# Patient Record
Sex: Female | Born: 1980 | Race: Black or African American | Hispanic: No | Marital: Single | Smoking: Current every day smoker
Health system: Southern US, Community
[De-identification: ages and names within clinical notes are randomized; demographics above are authoritative.]

## PROBLEM LIST (undated history)

## (undated) DIAGNOSIS — I10 Essential (primary) hypertension: Secondary | ICD-10-CM

## (undated) HISTORY — PX: WISDOM TOOTH EXTRACTION: SHX21

---
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
## 1999-03-22 ENCOUNTER — Encounter: Payer: Self-pay | Admitting: Obstetrics and Gynecology

## 1999-03-22 ENCOUNTER — Ambulatory Visit (HOSPITAL_COMMUNITY): Admission: RE | Admit: 1999-03-22 | Discharge: 1999-03-22 | Payer: Self-pay | Admitting: Obstetrics and Gynecology

## 1999-06-01 ENCOUNTER — Inpatient Hospital Stay (HOSPITAL_COMMUNITY): Admission: AD | Admit: 1999-06-01 | Discharge: 1999-06-01 | Payer: Self-pay | Admitting: Obstetrics and Gynecology

## 1999-06-06 ENCOUNTER — Inpatient Hospital Stay (HOSPITAL_COMMUNITY): Admission: AD | Admit: 1999-06-06 | Discharge: 1999-06-10 | Payer: Self-pay | Admitting: Obstetrics & Gynecology

## 1999-12-02 ENCOUNTER — Other Ambulatory Visit: Admission: RE | Admit: 1999-12-02 | Discharge: 1999-12-02 | Payer: Self-pay | Admitting: Obstetrics & Gynecology

## 1999-12-04 ENCOUNTER — Emergency Department (HOSPITAL_COMMUNITY): Admission: EM | Admit: 1999-12-04 | Discharge: 1999-12-04 | Payer: Self-pay | Admitting: *Deleted

## 2000-11-07 ENCOUNTER — Emergency Department (HOSPITAL_COMMUNITY): Admission: EM | Admit: 2000-11-07 | Discharge: 2000-11-07 | Payer: Self-pay

## 2001-02-07 ENCOUNTER — Other Ambulatory Visit: Admission: RE | Admit: 2001-02-07 | Discharge: 2001-02-07 | Payer: Self-pay | Admitting: Obstetrics and Gynecology

## 2001-05-15 ENCOUNTER — Emergency Department (HOSPITAL_COMMUNITY): Admission: EM | Admit: 2001-05-15 | Discharge: 2001-05-15 | Payer: Self-pay | Admitting: Emergency Medicine

## 2001-12-27 ENCOUNTER — Emergency Department (HOSPITAL_COMMUNITY): Admission: EM | Admit: 2001-12-27 | Discharge: 2001-12-27 | Payer: Self-pay | Admitting: Emergency Medicine

## 2003-08-28 ENCOUNTER — Emergency Department (HOSPITAL_COMMUNITY): Admission: EM | Admit: 2003-08-28 | Discharge: 2003-08-28 | Payer: Self-pay | Admitting: Emergency Medicine

## 2004-05-19 ENCOUNTER — Emergency Department (HOSPITAL_COMMUNITY): Admission: EM | Admit: 2004-05-19 | Discharge: 2004-05-19 | Payer: Self-pay | Admitting: Emergency Medicine

## 2005-03-03 ENCOUNTER — Emergency Department (HOSPITAL_COMMUNITY): Admission: EM | Admit: 2005-03-03 | Discharge: 2005-03-03 | Payer: Self-pay | Admitting: Emergency Medicine

## 2005-03-30 ENCOUNTER — Inpatient Hospital Stay (HOSPITAL_COMMUNITY): Admission: AD | Admit: 2005-03-30 | Discharge: 2005-04-03 | Payer: Self-pay | Admitting: Obstetrics & Gynecology

## 2005-03-30 ENCOUNTER — Encounter: Payer: Self-pay | Admitting: Emergency Medicine

## 2005-06-13 ENCOUNTER — Ambulatory Visit: Payer: Self-pay | Admitting: Family Medicine

## 2005-06-27 ENCOUNTER — Ambulatory Visit: Payer: Self-pay | Admitting: Family Medicine

## 2005-06-29 ENCOUNTER — Ambulatory Visit (HOSPITAL_COMMUNITY): Admission: RE | Admit: 2005-06-29 | Discharge: 2005-06-29 | Payer: Self-pay | Admitting: *Deleted

## 2005-07-11 ENCOUNTER — Ambulatory Visit: Payer: Self-pay | Admitting: Obstetrics & Gynecology

## 2005-07-12 ENCOUNTER — Inpatient Hospital Stay (HOSPITAL_COMMUNITY): Admission: AD | Admit: 2005-07-12 | Discharge: 2005-07-13 | Payer: Self-pay | Admitting: Gynecology

## 2005-08-11 ENCOUNTER — Ambulatory Visit: Payer: Self-pay | Admitting: Family Medicine

## 2005-09-05 ENCOUNTER — Ambulatory Visit: Payer: Self-pay | Admitting: Obstetrics & Gynecology

## 2005-09-07 ENCOUNTER — Inpatient Hospital Stay (HOSPITAL_COMMUNITY): Admission: AD | Admit: 2005-09-07 | Discharge: 2005-09-10 | Payer: Self-pay | Admitting: Obstetrics & Gynecology

## 2005-09-07 ENCOUNTER — Ambulatory Visit: Payer: Self-pay | Admitting: Certified Nurse Midwife

## 2005-12-23 ENCOUNTER — Emergency Department (HOSPITAL_COMMUNITY): Admission: EM | Admit: 2005-12-23 | Discharge: 2005-12-23 | Payer: Self-pay | Admitting: Emergency Medicine

## 2006-05-03 ENCOUNTER — Emergency Department (HOSPITAL_COMMUNITY): Admission: EM | Admit: 2006-05-03 | Discharge: 2006-05-03 | Payer: Self-pay | Admitting: Emergency Medicine

## 2006-07-19 ENCOUNTER — Emergency Department (HOSPITAL_COMMUNITY): Admission: EM | Admit: 2006-07-19 | Discharge: 2006-07-19 | Payer: Self-pay | Admitting: Emergency Medicine

## 2006-11-06 ENCOUNTER — Emergency Department (HOSPITAL_COMMUNITY): Admission: EM | Admit: 2006-11-06 | Discharge: 2006-11-06 | Payer: Self-pay | Admitting: Emergency Medicine

## 2007-05-16 ENCOUNTER — Emergency Department (HOSPITAL_COMMUNITY): Admission: EM | Admit: 2007-05-16 | Discharge: 2007-05-17 | Payer: Self-pay | Admitting: Emergency Medicine

## 2007-10-12 ENCOUNTER — Emergency Department (HOSPITAL_COMMUNITY): Admission: EM | Admit: 2007-10-12 | Discharge: 2007-10-12 | Payer: Self-pay | Admitting: Emergency Medicine

## 2007-11-01 ENCOUNTER — Emergency Department (HOSPITAL_COMMUNITY): Admission: EM | Admit: 2007-11-01 | Discharge: 2007-11-01 | Payer: Self-pay | Admitting: Emergency Medicine

## 2008-05-29 ENCOUNTER — Emergency Department (HOSPITAL_COMMUNITY): Admission: EM | Admit: 2008-05-29 | Discharge: 2008-05-29 | Payer: Self-pay | Admitting: Emergency Medicine

## 2010-04-06 ENCOUNTER — Emergency Department (HOSPITAL_COMMUNITY)
Admission: EM | Admit: 2010-04-06 | Discharge: 2010-04-06 | Disposition: A | Discharge status: Home / Self Care | Payer: Medicaid Other | Attending: Emergency Medicine | Admitting: Emergency Medicine

## 2010-04-06 DIAGNOSIS — K089 Disorder of teeth and supporting structures, unspecified: Secondary | ICD-10-CM | POA: Insufficient documentation

## 2010-04-06 DIAGNOSIS — F17200 Nicotine dependence, unspecified, uncomplicated: Secondary | ICD-10-CM | POA: Insufficient documentation

## 2010-06-20 ENCOUNTER — Emergency Department (HOSPITAL_COMMUNITY)
Admission: EM | Admit: 2010-06-20 | Discharge: 2010-06-20 | Disposition: A | Discharge status: Home / Self Care | Payer: Medicaid Other | Attending: Emergency Medicine | Admitting: Emergency Medicine

## 2010-06-20 DIAGNOSIS — R220 Localized swelling, mass and lump, head: Secondary | ICD-10-CM | POA: Insufficient documentation

## 2010-06-20 DIAGNOSIS — K089 Disorder of teeth and supporting structures, unspecified: Secondary | ICD-10-CM | POA: Insufficient documentation

## 2010-06-20 DIAGNOSIS — R221 Localized swelling, mass and lump, neck: Secondary | ICD-10-CM | POA: Insufficient documentation

## 2010-07-16 NOTE — H&P (Signed)
[place]. [facility]  Patient:    [name], [name]                          MRN: [id number] Attending:  Edward Jolly, M.D.                         History and Physical  CHIEF COMPLAINT:              Ms. [name] is a 30 year old single, black female, 3, para 0-0-2-0 at 41-5/7 weeks, who was scheduled for induction of labor today for postdatism.  HISTORY OF PRESENT ILLNESS:   She called this morning, with complaint of uterine contractions every five minutes, since early morning.  She denies rupture of membranes or bleeding, and reports positive fetal movement.  She has been followed by the nurse-midwifery service since October 2000.  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  PRENATAL LABORATORY STUDIES:  Hemoglobin 11.8, hematocrit 33.3, platelets 237.  Blood type B-positive.  Sickle cell trait negative.  RPR nonreactive.  Rubella titer positive.  Hepatitis negative.  Urinalysis negative.  Pap within normal limits, with yeast.  Gonorrhea negative.  Chlamydia negative.  An AFP showed free beta within normal limits.  Glucose challenge was within normal limits.  Group  Strep was negative.  OB HISTORY:                   Remarkable for induced abortion on November 1999 t ten weeks.  Induced abortion on Michale 2000 at 12 weeks with no complications.  MEDICAL HISTORY:              Remarkable for history of gonorrhea in 1998, which was treated, occasional yeast infections, usual childhood diseases, including varicella.  FAMILY HISTORY:               Remarkable for hypertension, heart disease, diabetes, in her mother who is now deceased.  GENETIC HISTORY:              Unremarkable.  SOCIAL HISTORY:               The patient is single.  The father of the baby is  [name],  who is only intermittently involved, and not present today. The patient reports that he is less supportive than she would prefer.  She has two female friends with her today, who are very supportive and who will be her labor partners.  She works as an Administrator, sports.  She is of the [place] faith.  PHYSICAL EXAMINATION:  GENERAL:                      The patient is afebrile.  VITAL SIGNS:                  Stable.  HEENT:                        Within normal limits.  THYROID:                      Within normal limits, no masses.  BREASTS:  Soft, nontender.  LUNGS:                        Clear to auscultation bilaterally.  HEART:                        Regular rate and rhythm.  No murmur.  ABDOMEN:                      Gravid at 42 cm.  Electronic fetal monitoring reveals reactive fetal heart rate, with positive accelerations and average variability.  Uterine contractions every 3-5 minutes, mild to moderate in strength.  CERVICAL:                     Per R.N. at 1+ cm, 50% effaced, high station, vertex.  EXTREMITIES:                  Within normal limits.  ASSESSMENT: 1. An IUP at 41-5/7 weeks. 2. Early labor. 3. She is GBS negative.  PLAN: 1. Admit to birthing suite for consult with Dr. Nelida Meuse Freely. 2. Routine C.N.M. orders. 3. Consider augmentation if no change in two hours. 4. Anticipate spontaneous vaginal delivery later today.  Dictated by:  Wynelle Bourgeois, C.N.M. DD:  06/06/99 TD:  06/06/99 Job: [id number]

## 2010-07-16 NOTE — Op Note (Signed)
[facility]  Patient:    [name], [name]                        MRN: [id number] Proc. Date: 06/07/99 Adm. Date:  [id number] Attending:  Cleatrice Burke                           Operative Report  PREOPERATIVE DIAGNOSIS:       Active phase arrest.  POSTOPERATIVE DIAGNOSIS:      Active phase arrest.  Plus occipitoposterior. Plus nuchal cord.  OPERATION:                    Low transverse cesarean section.  SURGEON:                      Cecilio Asper, M.D.  ASSISTANT:                    Wynelle Bourgeois, C.N.M.  ANESTHESIA:  ESTIMATED BLOOD LOSS:         600 cc.  URINE OUTPUT:                 200 cc.  FINDINGS:                     Viable female infant, Apgars 9 and 9, weighing 9 pounds 10 ounces, occipitoposterior, nuchal cord x 1, normal tubes and ovaries.  COMPLICATIONS:                None.  INDICATIONS:                  The patient is a 30 year old, gravida 3, para 0-0-2-0, who presented with regular uterine contractions at approximately 41-5/7 weeks estimated gestational age.  The patient was scheduled for induction of labor on Alawna 8, and therefore was admitted.  The patient did not progress spontaneously. Artificial rupture of membranes was performed with moderate meconium fluid.  Amnioinfusion was begun and Pitocin augmentation was begun.  The patient achieved Montevideo units consistently greater than 200 for greater than three hours.  The cervix did not change past 4 to 5 cm, 80 to 90% effaced, -1 station. The patient was therefore consented for a cesarean delivery.  DESCRIPTION OF PROCEDURE:     After adequate level of epidural anesthesia was obtained, the patient was prepped and draped in a sterile fashion.  A Foley had  previously been placed in the bladder to drain it of urine.  A low transverse incision was made down through the subcutaneous fat to the fascia.  The fascia as incised on either side of the  midline and extended laterally in both directions  with the Mayo scissors.  Kocher clamps were placed on the superior edge of the incision and the fascia was removed from the rectus muscles both superiorly and  inferiorly.  Pyramidalis muscle was transected in the midline.  The parietoperitoneum was grasped with hemostats and incised with Metzenbaum scissors. This incision was extended bluntly.  A bladder piece was placed inside of the incision.  The visceroperitoneum of the lower uterine segment was elevated, incised, in order to develop a bladder flap.  The bladder piece was placed inside this flap.  The uterus was entered in a low transverse cesarean fashion and extended with the bandage scissors.  The infants head was elevated and with assistance from the vacuum,  he was delivered.  He was noted to be occipitoposterior. There was a nuchal cord that was reduced on the operative field. He was DeLee and bulb suctioned on the operative field.  Anterior and posterior  shoulders were then delivered.  Cord was doubly clamped and cut and the infant as taken to the warmer where he was cared for by the pediatric team.  Cord bloods ere obtained.  The placenta was manually extracted.  The uterus was removed from the pelvic region and covered with a wet lap sponge.  The bladder piece was replaced inside of the incision.  The uterine incision was held with ring clamps.  The uterus was wiped clean with a wet lap sponge.  The uterus was reapproximated with a running suture of #0 Vicryl.  A second imbricating layer was used with good hemostatic result.  The uterus was replaced into the pelvis.  The gutters were cleaned.  Hemostasis was assured.  The parietoperitoneum was reapproximated. The fascia was then closed with a running suture of #1 Vicryl from the lateral edge to the midline.  Subcuticular was irrigated.  The skin was closed with skin staples. Bandage was applied.  The patient  tolerated the procedure well.  She was taken o the recovery room in stable condition.  Sponge, needle, and instrument counts were correct x 2. DD:  06/07/99 TD:  06/07/99 Job: [id number]

## 2010-07-16 NOTE — H&P (Signed)
NAMEGIULIETTA, [name]                 ACCOUNT NO.:  [id number]   MEDICAL RECORD NO.:  [id number]          PATIENT TYPE:  INP   LOCATION:  9318                          FACILITY:  [facility]   PHYSICIAN:  Roseanna Rainbow, M.D.DATE OF BIRTH:  25-Feb-1981   DATE OF ADMISSION:  03/30/2005  DATE OF DISCHARGE:                                HISTORY & PHYSICAL   CHIEF COMPLAINT:  The patient is a 30 year old, para 1, with a unsure LMP of  mid November, and positive urine pregnancy test, complaining of a several  day history of right flank pain, fever, and dysuria.   HISTORY OF PRESENT ILLNESS:  Please see the above.  The patient had  complained of dysuria prior to the onset of the flank pain.  She has had  fever with rigors.  She denies any concomitant complaints.  She reports a  good appetite.  Workup to date has included a negative nasal influenza rapid  test, a basic metabolic profile remarkable for a sodium of 131, and a  potassium of 2.9, CBC with a leukocytosis with a white blood cell count at  19,600, hemoconcentration with a hemoglobin of 13.5, and platelets mildly  decreased at 139,000.  The urinalysis was a poor specimen with many  epithelial cells, however, there is trichomonads noted, specific gravity  1.013, greater than 80 ketones, protein 100, moderate leukocyte esterase. A  Beta HCG quantitative was 11,000.  A preliminary ultrasound was consistent  with an intrauterine pregnancy.   OBSTETRICAL/GYNECOLOGIC HISTORY:  1.  She has a history of a previous cesarean delivery.  2.  Previous voluntary termination of pregnancy.  3.  She has a history of gonorrhea and trichomonas.   PAST MEDICAL HISTORY:  She denies.   PAST SURGICAL HISTORY:  Please see the above.   SOCIAL HISTORY:  One half pack per day tobacco use.  History of THC.  She  denies any alcohol use.   ALLERGIES:  No known drug allergies.   MEDICATIONS:  None.   FAMILY HISTORY:  Noncontributory.   PHYSICAL  EXAMINATION:  VITAL SIGNS:  Blood pressure 128/86, heart rate 131,  respiratory rate 20, temperature 99.7.  GENERAL:  Thin African American female in no apparent distress.  BACK:  Minimal CVA tenderness.  ABDOMEN:  Normoactive bowel sounds.  Soft, nondistended, nontender.  PELVIC:  Deferred.   ASSESSMENT:  1.  Early pregnant with rule out pyelonephritis.  2.  Moderate dehydration.  3.  Hypokalemia.  4.  Mild hyponatremia.  5.  Trichomonas infection.  6.  The patient is ambivalent about the pregnancy.   PLAN:  1.  Admission.  2.  IV hydration.  3.  Replete potassium.  4.  Empiric antibiotics.      Roseanna Rainbow, M.D.  Electronically Signed     LAJ/MEDQ  D:  03/30/2005  T:  03/30/2005  Job:  [id number]

## 2010-07-16 NOTE — Discharge Summary (Signed)
[facility]  Patient:    Crystal, Graham                        MRN: [id number] Adm. Date:  [id number] Attending:  Cleatrice Burke Dictator:   Wynelle Bourgeois, P.A.                           Discharge Summary  ADMISSION DIAGNOSES:          1. Intrauterine pregnancy at 41-4/7 weeks.                               2. Labor.                               3. Group B Streptococci negative.  DISCHARGE DIAGNOSES:          1. Intrauterine pregnancy at 41-4/7 weeks.                               2. Labor.                               3. Group B Streptococci negative.                               4. Active phase arrest, cesarean delivery of viable                                  female infant, named [name], Apgars 9 and 9, weight                                  9 pounds 10 ounces, occiput posterior, nuchal ord                                  x 1, and postpartum anemia.  PROCEDURES:                   Primary low transverse cesarean section.  HOSPITAL COURSE:              [name] was admitted on Gracilyn 8, 2001 at 0930 in early labor.  She had been scheduled for induction of labor that same day for postdatism and presented with uterine contractions every five minutes, with intact membranes. Her cervix, upon admission, was 1 cm 50% effaced and vertex high, and she was admitted with routine CMN orders and observed for the first two hours.  At 1135, a low dose Pitocin protocol was begun for augmentation of her labor.  Her cervix as essentially unchanged at 1-2, 60%, -2 vertex and posterior cervix.  At 1345, her contractions were getting stronger and her cervix had changed slightly to 2-3, 0%, and -1, with a bulging bag of water.  Membranes were ruptured for moderately stained meconium fluid and an IUPC was placed for the purpose for amnioinfusion. At 1600, her cervix had progressed to 3 cm, 90%, and -1 station, with  125-150 Montevideo units per 10  minutes, therefore, the Pitocin augmentation was continued with increases in the Pitocin to achieve an optimal labor pattern.  The patient was examined again at 1900 and her cervix was found to be 4 cm, 80-90%, and -1, with intermittently adequate labor.  At 2200, her cervix progressed to 4-5 cm, 80-90%, and -1 station, with an intermittently adequate labor pattern of 150-220 Montevideo units, with some coupling and an irregular pattern of contractions on 12 milliunits per minute of Pitocin.  At approximately midnight, her labor pattern had been adequate for the last almost two hours with 195-265 Montevideo units at 14 milliunits per minute of Pitocin, and her cervix was rechecked later, at 0140, nd found to be essentially unchanged at 4-5 cm, 80% effaced, with cervical edema, nd -1 station.  At that time, her labor pattern had been consistently adequate for the last several hours, with 250+ Montevideo units, and her pelvimetry was deemed to be adequate gynecoid.  However, the vertex had not descended into the pelvis.  Dr.  Kathryne Sharper was again consulted, as she had been throughout the day and given the information about the lack of cervical change.  The decision was made, at that time, by Dr. Kathryne Sharper to proceed with a primary low transverse cesarean section.  This was discussed with the patient and her family, who agreed to this plan.  Findings with the cesarean section included a viable female infant, named [name], Apgars 9 and 9, weight 9 pounds 10 ounces, and the presentation was occiput posterior, with a nuchal cord x 1.  There were no complications with the surgery and the patient was eventually transferred to the mother-baby unit, where her postpartum progress proceeded on a normal timeline.  On the day of discharge, Cooper 12, 2001, her lungs were clear, her heart rate was regular rate and rhythm, with no murmur, her incision was clean and dry with staples, her  lochia was small, and er hemoglobin had dropped to 8.4 postpartum, and she had been placed iron supplementation, and her vital signs were stable.  Therefore, she was deemed to be ready for discharge and was discharged home on Hanae 12, 2001, with discharge instructions and prescriptions for Tylox, Motrin, and ______ OB multivitamins with iron.  She will return to the office in six weeks or p.r.n.  DISCHARGE INSTRUCTIONS:       Per [facility] handout.  DISCHARGE LABORATORY DATA:    WBC of 12.7 on Ley[place] 10, 2001, hemoglobin 8.4, hematocrit 23.1, platelets 169, RPR nonreactive.  DISCHARGE MEDICATIONS:        1. Motrin.                               2. Tylox.                               3. ______.                               4. Depo-Provera, given prior to discharge.  DISCHARGE FOLLOW-UP:          Six weeks at [facility] or p.r.n. DD:  06/10/99 TD:  06/10/99 Job: [id number]

## 2010-07-16 NOTE — Discharge Summary (Signed)
[name], [name]                 ACCOUNT NO.:  [id number]   MEDICAL RECORD NO.:  [id number]          PATIENT TYPE:  INP   LOCATION:  9318                          [facility]ACILITY:  [facility]   PHYSICIAN:  Roseanna Rainbow, M.D.DATE OF BIRTH:  1980/09/16   DATE OF ADMISSION:  03/30/2005  DATE OF DISCHARGE:  04/03/2005                                 DISCHARGE SUMMARY   CHIEF COMPLAINT:  The patient is a 30 year old, para 1, with last menstrual  period of mid November, positive urine pregnancy test complaining of several-  day history of right flank pain, fever and dysuria.  Please see the dictated  history and physical for further details.   HOSPITAL COURSE:  The patient was admitted and started parenteral  antibiotics. She was also given p.o. Flagyl for the trichomoniasis.  An  ultrasound on January 31 demonstrated an intrauterine pregnancy at 15 weeks  4 days with an ultrasound EDC of July 21.  She had spiking fevers to 102 on  hospital day #1.  She was also given an antispasmodic for the urinary tract  infection. She was also noted to be hypokalemic and this was repleted.  She  had some episodes of nausea and vomiting.  Initially this was felt to be  related to the Flagyl and this was discontinued.  Her flank pain resolved as  well as her febrile episodes.  She was afebrile for 48 hours on the day of  discharge.  Urine cultures grew out pansensitive E coli.   DISCHARGE DIAGNOSIS:  1.  Intrauterine pregnancy at 15+ weeks.  2.  Pyelonephritis.  3.  Trichomoniasis.   CONDITION:  Stable.   DIET:  Regular.   ACTIVITY:  Ad lib.   MEDICATIONS:  Bactrim, Diflucan.   DISPOSITION:  The patient was to follow up in the office in one week.      Roseanna Rainbow, M.D.  Electronically Signed     LAJ/MEDQ  D:  04/03/2005  T:  04/04/2005  Job:  [id number]

## 2010-12-01 LAB — STREP A DNA PROBE: Group A Strep Probe: NEGATIVE

## 2010-12-01 LAB — RAPID STREP SCREEN (MED CTR MEBANE ONLY): Streptococcus, Group A Screen (Direct): NEGATIVE

## 2010-12-10 LAB — POCT PREGNANCY, URINE: Preg Test, Ur: NEGATIVE

## 2010-12-10 LAB — GC/CHLAMYDIA PROBE AMP, GENITAL
Chlamydia, DNA Probe: NEGATIVE
GC Probe Amp, Genital: NEGATIVE

## 2010-12-10 LAB — WET PREP, GENITAL
Trich, Wet Prep: NONE SEEN
Yeast Wet Prep HPF POC: NONE SEEN

## 2010-12-10 LAB — URINALYSIS, ROUTINE W REFLEX MICROSCOPIC
Glucose, UA: NEGATIVE
Hgb urine dipstick: NEGATIVE
Ketones, ur: NEGATIVE
Protein, ur: NEGATIVE
pH: 6

## 2010-12-10 LAB — COMPREHENSIVE METABOLIC PANEL
ALT: 13
BUN: 6
Calcium: 9.2
Creatinine, Ser: 0.65
Glucose, Bld: 77
Sodium: 136
Total Protein: 7.2

## 2010-12-10 LAB — RPR: RPR Ser Ql: NONREACTIVE

## 2010-12-10 LAB — DIFFERENTIAL
Lymphocytes Relative: 15
Lymphs Abs: 1.4
Monocytes Relative: 7
Neutro Abs: 7.5
Neutrophils Relative %: 77

## 2010-12-10 LAB — CBC
Hemoglobin: 13.3
MCHC: 34.3
MCV: 87.3
RDW: 12.1

## 2010-12-10 LAB — URINE MICROSCOPIC-ADD ON

## 2011-07-01 ENCOUNTER — Emergency Department (HOSPITAL_COMMUNITY): Payer: Medicaid Other

## 2011-07-01 ENCOUNTER — Encounter (HOSPITAL_COMMUNITY): Payer: Self-pay | Admitting: Emergency Medicine

## 2011-07-01 ENCOUNTER — Emergency Department (HOSPITAL_COMMUNITY)
Admission: EM | Admit: 2011-07-01 | Discharge: 2011-07-01 | Disposition: A | Discharge status: Home / Self Care | Payer: Medicaid Other | Attending: Emergency Medicine | Admitting: Emergency Medicine

## 2011-07-01 DIAGNOSIS — S39012A Strain of muscle, fascia and tendon of lower back, initial encounter: Secondary | ICD-10-CM

## 2011-07-01 DIAGNOSIS — S335XXA Sprain of ligaments of lumbar spine, initial encounter: Secondary | ICD-10-CM | POA: Insufficient documentation

## 2011-07-01 DIAGNOSIS — W010XXA Fall on same level from slipping, tripping and stumbling without subsequent striking against object, initial encounter: Secondary | ICD-10-CM | POA: Insufficient documentation

## 2011-07-01 MED ORDER — KETOROLAC TROMETHAMINE 60 MG/2ML IM SOLN
60.0000 mg | Freq: Once | INTRAMUSCULAR | Status: AC
  Administered 2011-07-01: 60 mg via INTRAMUSCULAR
  Filled 2011-07-01: qty 2

## 2011-07-01 MED ORDER — IBUPROFEN 800 MG PO TABS: 800.0000 mg | ORAL_TABLET | Freq: Three times a day (TID) | ORAL | Status: AC | PRN

## 2011-07-01 MED ORDER — HYDROCODONE-ACETAMINOPHEN 5-325 MG PO TABS: 1.0000 | ORAL_TABLET | Freq: Four times a day (QID) | ORAL | Status: AC | PRN

## 2011-07-01 NOTE — Discharge Instructions (Signed)
The x-rays of your lower back did not show any abnormalities.  Return here for any worsening in her condition.  Use ice and heat in your lower back.

## 2011-07-01 NOTE — ED Provider Notes (Signed)
History     CSN: [id number]  Arrival date & time 07/01/11  1638   First MD Initiated Contact with Patient 07/01/11 1743      Chief Complaint  Patient presents with  . Fall    (Consider location/radiation/quality/duration/timing/severity/associated sxs/prior treatment) HPI Patient presents emergency room following a fall at 3 AM.  Patient states she was walking and grass was wet and she fell, landing on her back and buttocks.  Patient denies weakness or numbness in her lower extremities.  She also, states she is not having any trouble walking.  Patient states that the pain is mainly located in the left lateral lower back.  Patient denies abdominal pain, dysuria, numbness, weakness, nausea/vomiting, or difficulty walking.  Patient states she took 600 mg of ibuprofen without relief.  She states that movement and palpation make the pain worse.  States the pain does not radiate to her lower legs, but does radiate to the middle part of her back. History reviewed. No pertinent past medical history.  History reviewed. No pertinent past surgical history.  No family history on file.  History  Substance Use Topics  . Smoking status: Current Everyday Smoker  . Smokeless tobacco: Not on file  . Alcohol Use: Not on file    OB History    Grav Para Term Preterm Abortions TAB SAB Ect Mult Living                  Review of Systems All other systems negative except as documented in the HPI. All pertinent positives and negatives as reviewed in the HPI.  Allergies  Review of patient's allergies indicates no known allergies.  Home Medications   Current Outpatient Rx  Name Route Sig Dispense Refill  . NORGESTIM-ETH ESTRAD TRIPHASIC 0.18/0.215/0.25 MG-35 MCG PO TABS Oral Take 1 tablet by mouth daily.      BP 125/94  Pulse 88  Temp(Src) 98.6 F (37 C) (Oral)  Resp 20  Wt 148 lb (67.132 kg)  SpO2 100%  LMP 06/20/2011  Physical Exam Physical Examination: General appearance - alert,  well appearing, and in no distress, oriented to person, place, and time and normal appearing weight Mental status - alert, oriented to person, place, and time, normal mood, behavior, speech, dress, motor activity, and thought processes Chest - clear to auscultation, no wheezes, rales or rhonchi, symmetric air entry Heart - normal rate, regular rhythm, normal S1, S2, no murmurs, rubs, clicks or gallops Back exam - tenderness noted in the left lower back over the lateral musculature, normal reflexes and strength bilateral lower extremities, sensory exam intact bilateral lower extremities Neurological - alert, oriented, normal speech, no focal findings or movement disorder noted, DTR's normal and symmetric, motor and sensory grossly normal bilaterally, normal muscle tone, no tremors, strength 5/5  ED Course  Procedures (including critical care time)  Labs Reviewed - No data to display Dg Lumbar Spine Complete  07/01/2011  *RADIOLOGY REPORT*  Clinical Data: 31 year old female status post fall with pain.  LUMBAR SPINE - COMPLETE 4+ VIEW  Comparison: None.  Findings: Hypoplastic ribs at T12.  Normal lumbar segmentation. Bone mineralization is within normal limits.  Normal vertebral body height and alignment.  Relatively preserved disc spaces.  No pars fracture.  Sacrum and SI joints within normal limits.  Nonobstructed bowel gas pattern.  There is a 6 x 11 mm calculus which projects caudal to the right renal lower pole shadow.  This appears to be outside of the bowel on some images.  IMPRESSION: 1.  Negative radiographic appearance of the lumbar spine. 2.  6 x 11 mm calculus in the right abdomen could be a gonadal vein phlebolith, enteric contents, or less likely a urologic calculus. If there is hematuria consider a follow-up noncontrast CT abdomen and pelvis.  Original Report Authenticated By: Harley Hallmark, M.D.    Patient x-rays reviewed, and no acute findings in the lumbar spine.  She is advised to use  ice and heat on her lower back. she is told to return here for any worsening in her condition.  This is most likely a lumbar strain, based on her history of present illness and physical exam findings.  Patient does not have any abnormal reflexes and her gait is not abnormal.   MDM  MDM Reviewed: nursing note and vitals Interpretation: x-ray            Carlyle Dolly, PA-C 07/01/11 1907

## 2011-07-01 NOTE — ED Notes (Signed)
Pt. Fell on the wet grass and partially landed on her back.  This happened at [facility], she has taken Advil with no relief.  Pt. Is ambulating without difficulty.  Rates pain as an 8

## 2011-07-01 NOTE — ED Provider Notes (Signed)
Medical screening examination/treatment/procedure(s) were performed by non-physician practitioner and as supervising physician I was immediately available for consultation/collaboration.  Imberly Troxler, MD 07/01/11 2358 

## 2012-03-16 ENCOUNTER — Emergency Department (HOSPITAL_COMMUNITY)
Admission: EM | Admit: 2012-03-16 | Discharge: 2012-03-16 | Disposition: A | Discharge status: Home / Self Care | Payer: Medicaid Other | Attending: Emergency Medicine | Admitting: Emergency Medicine

## 2012-03-16 ENCOUNTER — Encounter (HOSPITAL_COMMUNITY): Payer: Self-pay | Admitting: Emergency Medicine

## 2012-03-16 ENCOUNTER — Emergency Department (HOSPITAL_COMMUNITY): Payer: Medicaid Other

## 2012-03-16 DIAGNOSIS — Y9289 Other specified places as the place of occurrence of the external cause: Secondary | ICD-10-CM | POA: Insufficient documentation

## 2012-03-16 DIAGNOSIS — Y9389 Activity, other specified: Secondary | ICD-10-CM | POA: Insufficient documentation

## 2012-03-16 DIAGNOSIS — S8990XA Unspecified injury of unspecified lower leg, initial encounter: Secondary | ICD-10-CM | POA: Insufficient documentation

## 2012-03-16 DIAGNOSIS — S99919A Unspecified injury of unspecified ankle, initial encounter: Secondary | ICD-10-CM | POA: Insufficient documentation

## 2012-03-16 DIAGNOSIS — F17200 Nicotine dependence, unspecified, uncomplicated: Secondary | ICD-10-CM | POA: Insufficient documentation

## 2012-03-16 DIAGNOSIS — W2209XA Striking against other stationary object, initial encounter: Secondary | ICD-10-CM | POA: Insufficient documentation

## 2012-03-16 DIAGNOSIS — S99922A Unspecified injury of left foot, initial encounter: Secondary | ICD-10-CM

## 2012-03-16 NOTE — ED Provider Notes (Signed)
History   Scribed for Crystal Hutching, MD, the patient was seen in room WTR7/WTR7 . This chart was scribed by Lewanda Rife.   CSN: [id number]  Arrival date & time 03/16/12  1433   None     Chief Complaint  Patient presents with  . Toe Pain    (Consider location/radiation/quality/duration/timing/severity/associated sxs/prior treatment) HPI [name] is a 32 y.o. female who presents to the Emergency Department complaining of waxing and waning moderate right 4th toe pain since 9 am this morning when pt stubbed her toe on a door on the way to a funeral. Pt describes the pain as throbbing. Pt denies having a fever, chest pain, and cough. Pt reports pain is 8/10 when bearing weight on right 4th toe and mild while at rest.  Pt denies taking anything for pain or applying ice prior to arrival. Pt denies having any significant past medical history.   History reviewed. No pertinent past medical history.  History reviewed. No pertinent past surgical history.  History reviewed. No pertinent family history.  History  Substance Use Topics  . Smoking status: Current Every Day Smoker  . Smokeless tobacco: Not on file  . Alcohol Use: Not on file    OB History    Grav Para Term Preterm Abortions TAB SAB Ect Mult Living                  Review of Systems  Constitutional: Negative.  Negative for fever.  HENT: Negative.   Respiratory: Negative.  Negative for cough.   Cardiovascular: Negative.  Negative for chest pain.  Gastrointestinal: Negative.  Negative for nausea and vomiting.  Musculoskeletal: Positive for myalgias.       4th toe pain   Skin: Negative.   Neurological: Negative.   Hematological: Negative.   Psychiatric/Behavioral: Negative.   All other systems reviewed and are negative.    Allergies  Review of patient's allergies indicates no known allergies.  Home Medications   Current Outpatient Rx  Name  Route  Sig  Dispense  Refill  . ACETAMINOPHEN 500 MG PO  TABS   Oral   Take 500 mg by mouth every 6 (six) hours as needed. For pain         . NORGESTIM-ETH ESTRAD TRIPHASIC 0.18/0.215/0.25 MG-35 MCG PO TABS   Oral   Take 1 tablet by mouth daily.           BP 155/93  Pulse 84  Temp 98 F (36.7 C) (Oral)  Resp 18  SpO2 99%  LMP 02/22/2012  Physical Exam  Nursing note and vitals reviewed. Constitutional: She is oriented to person, place, and time. She appears well-developed and well-nourished.  Non-toxic appearance.  HENT:  Head: Normocephalic and atraumatic.  Eyes: Conjunctivae normal and EOM are normal.  Neck: Normal range of motion. Neck supple.  Cardiovascular: Normal rate, regular rhythm, normal heart sounds and intact distal pulses.  Exam reveals no gallop and no friction rub.   No murmur heard. Pulmonary/Chest: Effort normal and breath sounds normal.  Musculoskeletal: Normal range of motion.       5/5 strength of feet bilaterally    Neurological: She is alert and oriented to person, place, and time.       Sensation to light touch intact  Skin: Skin is warm and dry.  Psychiatric: She has a normal mood and affect.    ED Course  Procedures (including critical care time)  Labs Reviewed - No data to display No results found.  Dg Toe 4th Left  03/16/2012  *RADIOLOGY REPORT*  Clinical Data: Fourth toe injury and pain.  LEFT FOURTH TOE - 3 view  Comparison:  None.  Findings:  There is no evidence of fracture or dislocation.  There is no evidence of arthropathy or other focal bone abnormality. Soft tissues are unremarkable.  IMPRESSION: Negative.   Original Report Authenticated By: Myles Rosenthal, M.D.     Diagnosis: left toe injury, 4th metatarsal    MDM  No acute fracture. Neurovascularly intact. Buddy taped injured toe. Directed patient to ice, elevate, and rest, and to use ibuprofen or Tylenol for pain. Pt in agreement with plan.   Glade Nurse, PA-C 03/17/12 1132

## 2012-03-16 NOTE — ED Notes (Signed)
Pt hit 4th toe on left foot earlier today. Toe now appears bruised and is painful to walk on per pt.

## 2012-03-17 NOTE — ED Provider Notes (Signed)
Medical screening examination/treatment/procedure(s) were performed by non-physician practitioner and as supervising physician I was immediately available for consultation/collaboration.  [name], MD 03/17/12 [phone]

## 2012-05-16 ENCOUNTER — Encounter (HOSPITAL_COMMUNITY): Payer: Self-pay | Admitting: Emergency Medicine

## 2012-05-16 ENCOUNTER — Emergency Department (HOSPITAL_COMMUNITY)
Admission: EM | Admit: 2012-05-16 | Discharge: 2012-05-16 | Disposition: A | Discharge status: Home / Self Care | Payer: Medicaid Other | Attending: Emergency Medicine | Admitting: Emergency Medicine

## 2012-05-16 DIAGNOSIS — Z79899 Other long term (current) drug therapy: Secondary | ICD-10-CM | POA: Insufficient documentation

## 2012-05-16 DIAGNOSIS — K0889 Other specified disorders of teeth and supporting structures: Secondary | ICD-10-CM

## 2012-05-16 DIAGNOSIS — K089 Disorder of teeth and supporting structures, unspecified: Secondary | ICD-10-CM | POA: Insufficient documentation

## 2012-05-16 DIAGNOSIS — F17200 Nicotine dependence, unspecified, uncomplicated: Secondary | ICD-10-CM | POA: Insufficient documentation

## 2012-05-16 DIAGNOSIS — I10 Essential (primary) hypertension: Secondary | ICD-10-CM | POA: Insufficient documentation

## 2012-05-16 HISTORY — DX: Essential (primary) hypertension: I10

## 2012-05-16 MED ORDER — ACETAMINOPHEN-CODEINE #3 300-30 MG PO TABS: 1.0000 | ORAL_TABLET | Freq: Four times a day (QID) | ORAL | Status: DC | PRN

## 2012-05-16 NOTE — ED Notes (Signed)
States that she has pain in her upper right molar. States that she has had the pain for the past 3 days. States that she has taken ibuprofen with no relief of pain.

## 2012-05-16 NOTE — ED Provider Notes (Signed)
History     CSN: [id number]  Arrival date & time 05/16/12  4098   First MD Initiated Contact with Patient 05/16/12 1001      Chief Complaint  Patient presents with  . Dental Pain    (Consider location/radiation/quality/duration/timing/severity/associated sxs/prior treatment) HPI Comments: 32 year old female presents emergency department complaining of right upper dental pain x1 week worsening over the past 3 days. States one week ago she bit into something causing her tooth to chip. Describes the pain as throbbing, rated 6/10, worse with cold increasing to 10 out of 10. She's tried taking ibuprofen without any relief. Denies facial swelling or difficulty swallowing. No fever chills.  Patient is a 32 y.o. female presenting with tooth pain. The history is provided by the patient.  Dental Pain Additional symptoms do not include: facial swelling and trouble swallowing.    Past Medical History  Diagnosis Date  . Hypertension     No past surgical history on file.  No family history on file.  History  Substance Use Topics  . Smoking status: Current Every Day Smoker  . Smokeless tobacco: Not on file  . Alcohol Use: 0.6 oz/week    1 Glasses of wine per week    OB History   Grav Para Term Preterm Abortions TAB SAB Ect Mult Living                  Review of Systems  HENT: Positive for dental problem. Negative for facial swelling and trouble swallowing.   All other systems reviewed and are negative.    Allergies  Review of patient's allergies indicates no known allergies.  Home Medications   Current Outpatient Rx  Name  Route  Sig  Dispense  Refill  . acetaminophen (TYLENOL) 500 MG tablet   Oral   Take 500 mg by mouth every 6 (six) hours as needed. For pain         . acetaminophen-codeine (TYLENOL #3) 300-30 MG per tablet   Oral   Take 1-2 tablets by mouth every 6 (six) hours as needed for pain.   15 tablet   0   . Norgestimate-Ethinyl Estradiol Triphasic  (ORTHO TRI-CYCLEN, 28,) 0.18/0.215/0.25 MG-35 MCG tablet   Oral   Take 1 tablet by mouth daily.           BP 147/122  Pulse 100  Temp(Src) 98.3 F (36.8 C) (Oral)  Resp 17  SpO2 100%  LMP 05/16/2012  Physical Exam  Nursing note and vitals reviewed. Constitutional: She is oriented to person, place, and time. She appears well-developed and well-nourished. No distress.  HENT:  Head: Normocephalic and atraumatic.  Mouth/Throat: Uvula is midline, oropharynx is clear and moist and mucous membranes are normal.    Eyes: Conjunctivae and EOM are normal.  Neck: Normal range of motion. Neck supple.  Cardiovascular: Normal rate, regular rhythm and normal heart sounds.   Pulmonary/Chest: Effort normal and breath sounds normal. No respiratory distress.  Musculoskeletal: Normal range of motion. She exhibits no edema.  Neurological: She is alert and oriented to person, place, and time. No sensory deficit.  Skin: Skin is warm and dry.  Psychiatric: She has a normal mood and affect. Her behavior is normal.    ED Course  Procedures (including critical care time)  Labs Reviewed - No data to display No results found.   1. Pain, dental       MDM   Dental pain without associated with dental infection. No evidence of dental abscess. Patient  is afebrile, non toxic appearing and swallowing secretions well. I gave patient referral to dentist and stressed the importance of dental follow up for ultimate management of dental pain. I will give pain control. Patient voices understanding and is agreeable to plan.         Trevor Mace, PA-C 05/16/12 1041

## 2012-05-21 NOTE — ED Provider Notes (Signed)
Medical screening examination/treatment/procedure(s) were performed by non-physician practitioner and as supervising physician I was immediately available for consultation/collaboration.   Ruhee Enck E Tonia Avino, MD 05/21/12 0744 

## 2012-10-13 ENCOUNTER — Emergency Department (HOSPITAL_COMMUNITY)
Admission: EM | Admit: 2012-10-13 | Discharge: 2012-10-13 | Disposition: A | Discharge status: Home / Self Care | Payer: Medicaid Other | Attending: Emergency Medicine | Admitting: Emergency Medicine

## 2012-10-13 ENCOUNTER — Encounter (HOSPITAL_COMMUNITY): Payer: Self-pay | Admitting: Emergency Medicine

## 2012-10-13 DIAGNOSIS — R1013 Epigastric pain: Secondary | ICD-10-CM | POA: Insufficient documentation

## 2012-10-13 DIAGNOSIS — Z3202 Encounter for pregnancy test, result negative: Secondary | ICD-10-CM | POA: Insufficient documentation

## 2012-10-13 DIAGNOSIS — R197 Diarrhea, unspecified: Secondary | ICD-10-CM | POA: Insufficient documentation

## 2012-10-13 DIAGNOSIS — I10 Essential (primary) hypertension: Secondary | ICD-10-CM | POA: Insufficient documentation

## 2012-10-13 DIAGNOSIS — F17200 Nicotine dependence, unspecified, uncomplicated: Secondary | ICD-10-CM | POA: Insufficient documentation

## 2012-10-13 DIAGNOSIS — IMO0002 Reserved for concepts with insufficient information to code with codable children: Secondary | ICD-10-CM | POA: Insufficient documentation

## 2012-10-13 DIAGNOSIS — K529 Noninfective gastroenteritis and colitis, unspecified: Secondary | ICD-10-CM

## 2012-10-13 DIAGNOSIS — K5289 Other specified noninfective gastroenteritis and colitis: Secondary | ICD-10-CM | POA: Insufficient documentation

## 2012-10-13 LAB — CBC WITH DIFFERENTIAL/PLATELET
Basophils Absolute: 0 10*3/uL (ref 0.0–0.1)
Eosinophils Absolute: 0.1 10*3/uL (ref 0.0–0.7)
Eosinophils Relative: 1 % (ref 0–5)
Lymphocytes Relative: 25 % (ref 12–46)
Lymphs Abs: 2.3 10*3/uL (ref 0.7–4.0)
MCH: 30.2 pg (ref 26.0–34.0)
Neutrophils Relative %: 69 % (ref 43–77)
Platelets: 308 10*3/uL (ref 150–400)
RBC: 5.1 MIL/uL (ref 3.87–5.11)
RDW: 12.4 % (ref 11.5–15.5)
WBC: 9.4 10*3/uL (ref 4.0–10.5)

## 2012-10-13 LAB — COMPREHENSIVE METABOLIC PANEL
Albumin: 4.1 g/dL (ref 3.5–5.2)
BUN: 7 mg/dL (ref 6–23)
Calcium: 9.7 mg/dL (ref 8.4–10.5)
Chloride: 104 mEq/L (ref 96–112)
Creatinine, Ser: 0.69 mg/dL (ref 0.50–1.10)
Total Bilirubin: 0.3 mg/dL (ref 0.3–1.2)

## 2012-10-13 LAB — LIPASE, BLOOD: Lipase: 34 U/L (ref 11–59)

## 2012-10-13 LAB — POCT PREGNANCY, URINE: Preg Test, Ur: NEGATIVE

## 2012-10-13 MED ORDER — SODIUM CHLORIDE 0.9 % IV BOLUS (SEPSIS)
1000.0000 mL | Freq: Once | INTRAVENOUS | Status: AC
  Administered 2012-10-13: 1000 mL via INTRAVENOUS

## 2012-10-13 MED ORDER — GI COCKTAIL ~~LOC~~
30.0000 mL | Freq: Once | ORAL | Status: AC
  Administered 2012-10-13: 30 mL via ORAL
  Filled 2012-10-13: qty 30

## 2012-10-13 MED ORDER — ONDANSETRON HCL 4 MG/2ML IJ SOLN
4.0000 mg | Freq: Once | INTRAMUSCULAR | Status: AC
  Administered 2012-10-13: 4 mg via INTRAVENOUS
  Filled 2012-10-13: qty 2

## 2012-10-13 MED ORDER — PROMETHAZINE HCL 25 MG PO TABS: 25.0000 mg | ORAL_TABLET | Freq: Four times a day (QID) | ORAL | Status: DC | PRN

## 2012-10-13 NOTE — ED Provider Notes (Signed)
CSN: [id number]     Arrival date & time 10/13/12  1634 History     First MD Initiated Contact with Patient 10/13/12 1710     Chief Complaint  Patient presents with  . Emesis  . Diarrhea   (Consider location/radiation/quality/duration/timing/severity/associated sxs/prior Treatment) Patient is a 32 y.o. female presenting with abdominal pain. The history is provided by the patient.  Abdominal Pain Pain location:  Epigastric Pain quality: aching   Pain radiates to:  Does not radiate Pain severity now: 7/10. Onset quality:  Sudden Duration:  12 hours Timing:  Constant Associated symptoms: diarrhea and vomiting   Associated symptoms: no dysuria, no fever, no hematemesis and no shortness of breath   Diarrhea:    Quality:  Watery   Number of occurrences:  7 Vomiting:    Number of occurrences:  2   Past Medical History  Diagnosis Date  . Hypertension    History reviewed. No pertinent past surgical history. History reviewed. No pertinent family history. History  Substance Use Topics  . Smoking status: Current Every Day Smoker  . Smokeless tobacco: Not on file  . Alcohol Use: 0.6 oz/week    1 Glasses of wine per week   OB History   Grav Para Term Preterm Abortions TAB SAB Ect Mult Living                 Review of Systems  Constitutional: Negative for fever.  Respiratory: Negative for shortness of breath.   Gastrointestinal: Positive for vomiting, abdominal pain and diarrhea. Negative for blood in stool and hematemesis.  Genitourinary: Negative for dysuria.  Musculoskeletal: Negative for back pain.  All other systems reviewed and are negative.    Allergies  Review of patient's allergies indicates no known allergies.  Home Medications   Current Outpatient Rx  Name  Route  Sig  Dispense  Refill  . bismuth subsalicylate (PEPTO BISMOL) 262 MG chewable tablet   Oral   Chew 524 mg by mouth as needed for indigestion.         . dimenhyDRINATE (DRAMAMINE) 50 MG  tablet   Oral   Take 50 mg by mouth every 8 (eight) hours as needed (for nausea).         Marland Kitchen ibuprofen (ADVIL,MOTRIN) 200 MG tablet   Oral   Take 400 mg by mouth every 6 (six) hours as needed for pain.         Marland Kitchen levonorgestrel-ethinyl estradiol (SEASONALE,INTROVALE,JOLESSA) 0.15-0.03 MG tablet   Oral   Take 1 tablet by mouth daily.         Marland Kitchen acetaminophen (TYLENOL) 500 MG tablet   Oral   Take 500 mg by mouth every 6 (six) hours as needed. For pain          BP 129/86  Pulse 95  Temp(Src) 98.7 F (37.1 C) (Oral)  Resp 20  SpO2 98% Physical Exam  Vitals reviewed. Constitutional: She is oriented to person, place, and time. She appears well-developed and well-nourished.  HENT:  Head: Normocephalic and atraumatic.  Right Ear: External ear normal.  Left Ear: External ear normal.  Nose: Nose normal.  Eyes: Right eye exhibits no discharge. Left eye exhibits no discharge.  Cardiovascular: Normal rate, regular rhythm and normal heart sounds.   Pulmonary/Chest: Effort normal and breath sounds normal.  Abdominal: Soft. There is tenderness in the epigastric area.  Neurological: She is alert and oriented to person, place, and time.  Skin: Skin is warm and dry.    ED  Course   Procedures (including critical care time)  Labs Reviewed  CBC WITH DIFFERENTIAL - Abnormal; Notable for the following:    Hemoglobin 15.4 (*)    All other components within normal limits  COMPREHENSIVE METABOLIC PANEL  LIPASE, BLOOD  POCT PREGNANCY, URINE   No results found. 1. Gastroenteritis     MDM  Mild abd tenderness is likely related to gastritis. Improved with GI cocktail, and nausea resolved with zofran. C/w viral gastroenteritis. No focal lower abd pain to suggest appendicitis or pelvic pathology. Discussed hydration and return precautions with patient.  Audree Camel, MD 10/14/12 [phone]

## 2012-10-13 NOTE — ED Notes (Addendum)
Pt presents with abdominal pain, vomiting, and diarrhea since 0600 this morning.  Pt denies fevers.

## 2012-10-13 NOTE — ED Notes (Signed)
Pt notified that urine sample is needed and labeled sample cup is at bedside

## 2013-01-27 ENCOUNTER — Emergency Department (HOSPITAL_COMMUNITY)
Admission: EM | Admit: 2013-01-27 | Discharge: 2013-01-27 | Disposition: A | Discharge status: Home / Self Care | Payer: Medicaid Other | Attending: Emergency Medicine | Admitting: Emergency Medicine

## 2013-01-27 ENCOUNTER — Encounter (HOSPITAL_COMMUNITY): Payer: Self-pay | Admitting: Emergency Medicine

## 2013-01-27 DIAGNOSIS — B9789 Other viral agents as the cause of diseases classified elsewhere: Secondary | ICD-10-CM

## 2013-01-27 DIAGNOSIS — J029 Acute pharyngitis, unspecified: Secondary | ICD-10-CM | POA: Insufficient documentation

## 2013-01-27 DIAGNOSIS — F17200 Nicotine dependence, unspecified, uncomplicated: Secondary | ICD-10-CM | POA: Insufficient documentation

## 2013-01-27 DIAGNOSIS — R52 Pain, unspecified: Secondary | ICD-10-CM | POA: Insufficient documentation

## 2013-01-27 DIAGNOSIS — Z79899 Other long term (current) drug therapy: Secondary | ICD-10-CM | POA: Insufficient documentation

## 2013-01-27 DIAGNOSIS — R42 Dizziness and giddiness: Secondary | ICD-10-CM | POA: Insufficient documentation

## 2013-01-27 DIAGNOSIS — I10 Essential (primary) hypertension: Secondary | ICD-10-CM | POA: Insufficient documentation

## 2013-01-27 DIAGNOSIS — J069 Acute upper respiratory infection, unspecified: Secondary | ICD-10-CM | POA: Insufficient documentation

## 2013-01-27 MED ORDER — PSEUDOEPHEDRINE HCL ER 120 MG PO TB12: 120.0000 mg | ORAL_TABLET | Freq: Two times a day (BID) | ORAL | Status: DC | PRN

## 2013-01-27 MED ORDER — HYDROCODONE-ACETAMINOPHEN 5-325 MG PO TABS: 1.0000 | ORAL_TABLET | ORAL | Status: DC | PRN

## 2013-01-27 MED ORDER — IBUPROFEN 800 MG PO TABS: 800.0000 mg | ORAL_TABLET | Freq: Three times a day (TID) | ORAL | Status: DC

## 2013-01-27 NOTE — ED Notes (Signed)
Pt states she has been sick for two days with cough, fever,  Headache,  Runny nose,  "just wants to sleep"

## 2013-01-27 NOTE — ED Provider Notes (Signed)
CSN: [id number]     Arrival date & time 01/27/13  2050 History  This chart was scribed for non-physician practitioner Kyung Bacca, PA-C working with No att. providers found by Caryn Bee, ED Scribe. This patient was seen in room WTR7/WTR7 and the patient's care was started at 9:20 PM.    Chief Complaint  Patient presents with  . Fever  . Cough  . Headache   HPI HPI Comments: [name] is a 32 y.o. female who presents to the Emergency Department complaining of gradual onset fever that began 2 days ago. She also reports associated gradual onset sore throat that began yesterday morning, but has since resolved. Pt reports associated productive cough, sneezing, nasal/chest congestion, and trouble breathing. Pt also reports constant, diffuse, pressure-like headache. Associated w/ chills, body aches, decreased appetite, light headedness.  Denies dizziness, vision changes, N/V/D and urinary sx.  Pt has taken Dayquil and Nyquil with no relief. She denies h/o medical problems. Pt denies recent head injury.   Past Medical History  Diagnosis Date  . Hypertension    No past surgical history on file. No family history on file. History  Substance Use Topics  . Smoking status: Current Every Day Smoker  . Smokeless tobacco: Not on file  . Alcohol Use: 0.6 oz/week    1 Glasses of wine per week   OB History   Grav Para Term Preterm Abortions TAB SAB Ect Mult Living                 Review of Systems  Constitutional: Positive for fever, chills and appetite change.  HENT: Positive for congestion, rhinorrhea and sore throat. Negative for ear pain.   Eyes: Negative for visual disturbance.  Respiratory: Positive for cough.   Gastrointestinal: Negative for vomiting and diarrhea.  Genitourinary: Negative for dysuria, frequency, hematuria and difficulty urinating.  Neurological: Positive for headaches.  All other systems reviewed and are negative.    Allergies  Review of patient's  allergies indicates no known allergies.  Home Medications   Current Outpatient Rx  Name  Route  Sig  Dispense  Refill  . acetaminophen (TYLENOL) 500 MG tablet   Oral   Take 500 mg by mouth every 6 (six) hours as needed. For pain         . bismuth subsalicylate (PEPTO BISMOL) 262 MG chewable tablet   Oral   Chew 524 mg by mouth as needed for indigestion.         . dimenhyDRINATE (DRAMAMINE) 50 MG tablet   Oral   Take 50 mg by mouth every 8 (eight) hours as needed (for nausea).         Marland Kitchen ibuprofen (ADVIL,MOTRIN) 200 MG tablet   Oral   Take 400 mg by mouth every 6 (six) hours as needed for pain.         Marland Kitchen levonorgestrel-ethinyl estradiol (SEASONALE,INTROVALE,JOLESSA) 0.15-0.03 MG tablet   Oral   Take 1 tablet by mouth daily.         . promethazine (PHENERGAN) 25 MG tablet   Oral   Take 1 tablet (25 mg total) by mouth every 6 (six) hours as needed for nausea.   10 tablet   0    BP 154/90  Pulse 104  Temp(Src) 98.2 F (36.8 C) (Oral)  Resp 20  SpO2 98%  Physical Exam  Nursing note and vitals reviewed. Constitutional: She is oriented to person, place, and time. She appears well-developed and well-nourished. No distress.  HENT:  Head: Normocephalic and atraumatic.  Mouth/Throat: Oropharynx is clear and moist. No oropharyngeal exudate.  Bilateral EAC and TM nml.  Posterior pharynx and soft palate erythematous.  Mild, symmetric tonsillar edema w/out exudate.  Uvula mid-line.  No trismus.  No sinus ttp.    Eyes: Conjunctivae are normal.  Neck: Normal range of motion. Neck supple.  Cardiovascular: Normal rate, regular rhythm and normal heart sounds.   Pulmonary/Chest: Effort normal and breath sounds normal. No respiratory distress. She has no wheezes. She has no rales. She exhibits no tenderness.  Musculoskeletal: Normal range of motion.  Lymphadenopathy:    She has cervical adenopathy.  Neurological: She is alert and oriented to person, place, and time.  CN 3-12  intact.  No sensory deficits.  5/5 and equal upper and lower extremity strength.  No past pointing.   Skin: Skin is warm.  Psychiatric: She has a normal mood and affect. Her behavior is normal.    ED Course  Procedures (including critical care time) DIAGNOSTIC STUDIES: Oxygen Saturation is 98% on room air, normal by my interpretation.    COORDINATION OF CARE: 9:31 PM-Discussed treatment plan with pt at bedside and pt agreed to plan.   Labs Review Labs Reviewed - No data to display Imaging Review No results found.  EKG Interpretation   None       MDM   1. Viral respiratory illness    32yo healthy F presents w/ respiratory illness x 2 days.  On exam, afebrile, non-toxic appearing, dry mucous membranes and mildly tachycardic, erythematous posterior pharynx and symmetrically enlarged tonsils, cervical adenopathy, no respiratory distress, nml breath sounds, coughing.  Suspect influenza or similar respiratory virus.  Pt on medicaid.  Prescribed 8 vicodin for cough suppression, 800mg  ibuprofen for fever, sore throat and head/body aches, and sudafed.  Recommended fluids and rest.  Return precautions discussed.  9:44 PM   I personally performed the services described in this documentation, which was scribed in my presence. The recorded information has been reviewed and is accurate.   Otilio Miu, PA-C 01/27/13 2317

## 2013-01-27 NOTE — ED Notes (Signed)
Pt took nyquill at 2 pm

## 2013-01-28 NOTE — ED Provider Notes (Signed)
Medical screening examination/treatment/procedure(s) were performed by non-physician practitioner and as supervising physician I was immediately available for consultation/collaboration.  EKG Interpretation   None        Ethelda Chick, MD 01/28/13 [phone]

## 2013-03-31 ENCOUNTER — Encounter (HOSPITAL_COMMUNITY): Payer: Self-pay | Admitting: Emergency Medicine

## 2013-03-31 ENCOUNTER — Emergency Department (HOSPITAL_COMMUNITY)
Admission: EM | Admit: 2013-03-31 | Discharge: 2013-03-31 | Disposition: A | Discharge status: Home / Self Care | Payer: Medicaid Other | Attending: Emergency Medicine | Admitting: Emergency Medicine

## 2013-03-31 DIAGNOSIS — M62830 Muscle spasm of back: Secondary | ICD-10-CM

## 2013-03-31 DIAGNOSIS — Z79899 Other long term (current) drug therapy: Secondary | ICD-10-CM | POA: Insufficient documentation

## 2013-03-31 DIAGNOSIS — M62838 Other muscle spasm: Secondary | ICD-10-CM | POA: Insufficient documentation

## 2013-03-31 DIAGNOSIS — F17200 Nicotine dependence, unspecified, uncomplicated: Secondary | ICD-10-CM | POA: Insufficient documentation

## 2013-03-31 DIAGNOSIS — I10 Essential (primary) hypertension: Secondary | ICD-10-CM | POA: Insufficient documentation

## 2013-03-31 MED ORDER — HYDROCODONE-ACETAMINOPHEN 5-325 MG PO TABS
2.0000 | ORAL_TABLET | Freq: Once | ORAL | Status: AC
  Administered 2013-03-31: 2 via ORAL
  Filled 2013-03-31: qty 2

## 2013-03-31 MED ORDER — ONDANSETRON 4 MG PO TBDP
4.0000 mg | ORAL_TABLET | Freq: Once | ORAL | Status: AC
  Administered 2013-03-31: 4 mg via ORAL
  Filled 2013-03-31: qty 1

## 2013-03-31 MED ORDER — LORAZEPAM 1 MG PO TABS: 1.0000 mg | ORAL_TABLET | Freq: Three times a day (TID) | ORAL | Status: DC | PRN

## 2013-03-31 MED ORDER — HYDROCODONE-ACETAMINOPHEN 5-325 MG PO TABS: 1.0000 | ORAL_TABLET | Freq: Four times a day (QID) | ORAL | Status: DC | PRN

## 2013-03-31 NOTE — ED Provider Notes (Signed)
CSN: [id number]     Arrival date & time 03/31/13  1305 History  This chart was scribed for non-physician practitioner, Arthor CaptainAbigail Irwin Toran, PA-C working with Richardean Canalavid H Yao, MD by Greggory StallionKayla Andersen, ED scribe. This patient was seen in room WTR6/WTR6 and the patient's care was started at 2:36 PM.   Chief Complaint  Patient presents with  . Back Pain   The history is provided by the patient. No language interpreter was used.   HPI Comments: [name] is a [age] y.o. female who presents to the Emergency Department complaining of gradual onset, constant left, mid to lower back pain that started 3 days ago. She thinks she pulled a muscle at work. Pt has taken Goody powder with no relief. Denies gait problem, numbness, weakness, dysuria, difficulty urinating, bowel or bladder incontinence, rash.   Past Medical History  Diagnosis Date  . Hypertension    History reviewed. No pertinent past surgical history. No family history on file. History  Substance Use Topics  . Smoking status: Current Every Day Smoker  . Smokeless tobacco: Not on file  . Alcohol Use: 0.6 oz/week    1 Glasses of wine per week   OB History   Grav Para Term Preterm Abortions TAB SAB Ect Mult Living                 Review of Systems  Constitutional: Negative for fever.  HENT: Negative for congestion.   Eyes: Negative for redness.  Respiratory: Negative for shortness of breath.   Cardiovascular: Negative for chest pain.  Gastrointestinal: Negative for abdominal pain.  Genitourinary: Negative for dysuria and difficulty urinating.       Negative for bowel or bladder incontinence.   Musculoskeletal: Positive for back pain. Negative for gait problem.  Skin: Negative for rash.  Neurological: Negative for weakness and numbness.  Psychiatric/Behavioral: Negative for confusion.    Allergies  Review of patient's allergies indicates no known allergies.  Home Medications   Current Outpatient Rx  Name  Route  Sig  Dispense  Refill   . diphenhydrAMINE (SOMINEX) 25 MG tablet   Oral   Take 25 mg by mouth at bedtime as needed for itching, allergies or sleep.         Marland Kitchen. DM-Doxylamine-Acetaminophen (NYQUIL COLD & FLU PO)   Oral   Take 2 tablets by mouth daily as needed (cold).         Marland Kitchen. HYDROcodone-acetaminophen (NORCO/VICODIN) 5-325 MG per tablet   Oral   Take 1 tablet by mouth every 4 (four) hours as needed for moderate pain.   8 tablet   0   . ibuprofen (ADVIL,MOTRIN) 800 MG tablet   Oral   Take 1 tablet (800 mg total) by mouth 3 (three) times daily.   12 tablet   0   . Ibuprofen-Diphenhydramine Cit (ADVIL PM PO)   Oral   Take 1 tablet by mouth daily as needed (sleep).         Marland Kitchen. levonorgestrel-ethinyl estradiol (SEASONALE,INTROVALE,JOLESSA) 0.15-0.03 MG tablet   Oral   Take 1 tablet by mouth daily.         . pseudoephedrine (SUDAFED 12 HOUR) 120 MG 12 hr tablet   Oral   Take 1 tablet (120 mg total) by mouth every 12 (twelve) hours as needed for congestion.   14 tablet   0    BP 119/77  Pulse 99  Temp(Src) 98.2 F (36.8 C)  Resp 17  SpO2 99%  Physical Exam  Nursing  note and vitals reviewed. Constitutional: She is oriented to person, place, and time. She appears well-developed and well-nourished. No distress.  HENT:  Head: Normocephalic and atraumatic.  Eyes: EOM are normal.  Neck: Neck supple. No tracheal deviation present.  Cardiovascular: Normal rate.   Pulmonary/Chest: Effort normal. No respiratory distress.  Musculoskeletal: Normal range of motion.  No midline spinal tenderness. Significant spasm in left thoracic region.   Neurological: She is alert and oriented to person, place, and time.  Skin: Skin is warm and dry.  Psychiatric: She has a normal mood and affect. Her behavior is normal.    ED Course  Procedures (including critical care time)  DIAGNOSTIC STUDIES: Oxygen Saturation is 99% on RA, normal by my interpretation.    COORDINATION OF CARE: 2:37 PM-Discussed  treatment plan which includes a muscle relaxer and pain medication with pt at bedside and pt agreed to plan.   Labs Review Labs Reviewed - No data to display Imaging Review No results found.  EKG Interpretation   None       MDM   1. Back muscle spasm    Patient with back pain.  No neurological deficits and normal neuro exam.  Patient can walk but states is painful.  No loss of bowel or bladder control.  No concern for cauda equina.  No fever, night sweats, weight loss, h/o cancer, IVDU.  RICE protocol and pain medicine indicated and discussed with patient.   I personally performed the services described in this documentation, which was scribed in my presence. The recorded information has been reviewed and is accurate.    Arthor Captain, PA-C 04/03/13 1145

## 2013-03-31 NOTE — Discharge Instructions (Signed)
SEEK IMMEDIATE MEDICAL ATTENTION IF: New numbness, tingling, weakness, or problem with the use of your arms or legs.  Severe back pain not relieved with medications.  Change in bowel or bladder control.  Increasing pain in any areas of the body (such as chest or abdominal pain).  Shortness of breath, dizziness or fainting.  Nausea (feeling sick to your stomach), vomiting, fever, or sweats.  Followup with orthopedics if symptoms continue. Use conservative methods at home including heat therapy and cold therapy as we discussed. More information on cold therapy is listed below.  It is not reccommended to use heat treatment directly after an acute injury.  SEEK IMMEDIATE MEDICAL ATTENTION IF: New numbness, tingling, weakness, or problem with the use of your arms or legs.  Severe back pain not relieved with medications.  Change in bowel or bladder control.  Increasing pain in any areas of the body (such as chest or abdominal pain).  Shortness of breath, dizziness or fainting.  Nausea (feeling sick to your stomach), vomiting, fever, or sweats.  COLD THERAPY DIRECTIONS:  Ice or gel packs can be used to reduce both pain and swelling. Ice is the most helpful within the first 24 to 48 hours after an injury or flareup from overusing a muscle or joint.  Ice is effective, has very few side effects, and is safe for most people to use.   If you expose your skin to cold temperatures for too long or without the proper protection, you can damage your skin or nerves. Watch for signs of skin damage due to cold.   HOME CARE INSTRUCTIONS  Follow these tips to use ice and cold packs safely.  Place a dry or damp towel between the ice and skin. A damp towel will cool the skin more quickly, so you may need to shorten the time that the ice is used.  For a more rapid response, add gentle compression to the ice.  Ice for no more than 10 to 20 minutes at a time. The bonier the area you are icing, the less time it will  take to get the benefits of ice.  Check your skin after 5 minutes to make sure there are no signs of a poor response to cold or skin damage.  Rest 20 minutes or more in between uses.  Once your skin is numb, you can end your treatment. You can test numbness by very lightly touching your skin. The touch should be so light that you do not see the skin dimple from the pressure of your fingertip. When using ice, most people will feel these normal sensations in this order: cold, burning, aching, and numbness.  Do not use ice on someone who cannot communicate their responses to pain, such as small children or people with dementia.   HOW TO MAKE AN ICE PACK  To make an ice pack, do one of the following:  Place crushed ice or a bag of frozen vegetables in a sealable plastic bag. Squeeze out the excess air. Place this bag inside another plastic bag. Slide the bag into a pillowcase or place a damp towel between your skin and the bag.  Mix 3 parts water with 1 part rubbing alcohol. Freeze the mixture in a sealable plastic bag. When you remove the mixture from the freezer, it will be slushy. Squeeze out the excess air. Place this bag inside another plastic bag. Slide the bag into a pillowcase or place a damp towel between your skin and the bag.  SEEK MEDICAL CARE IF:  You develop white spots on your skin. This may give the skin a blotchy (mottled) appearance.  Your skin turns blue or pale.  Your skin becomes waxy or hard.  Your swelling gets worse.  MAKE SURE YOU:  Understand these instructions.  Will watch your condition.  Will get help right away if you are not doing well or get worse.    Chronic Pain Discharge Instructions  Emergency care providers appreciate that many patients coming to [place] are in severe pain and we wish to address their pain in the safest, most responsible manner.  It is important to recognize however, that the proper treatment of chronic pain differs from that of the pain of  injuries and acute illnesses.  Our goal is to provide quality, safe, personalized care and we thank you for giving [place] the opportunity to serve you. The use of narcotics and related agents for chronic pain syndromes may lead to additional physical and psychological problems.  Nearly as many people die from prescription narcotics each year as die from car crashes.  Additionally, this risk is increased if such prescriptions are obtained from a variety of sources.  Therefore, only your primary care physician or a pain management specialist is able to safely treat such syndromes with narcotic medications long-term.    Documentation revealing such prescriptions have been sought from multiple sources may prohibit [place] from providing a refill or different narcotic medication.  Your name may be checked first through the [facility] Controlled Substances Reporting System.  This database is a record of controlled substance medication prescriptions that the patient has received.  This has been established by [facility] in an effort to eliminate the dangerous, and often life threatening, practice of obtaining multiple prescriptions from different medical providers.   If you have a chronic pain syndrome (i.e. chronic headaches, recurrent back or neck pain, dental pain, abdominal or pelvis pain without a specific diagnosis, or neuropathic pain such as fibromyalgia) or recurrent visits for the same condition without an acute diagnosis, you may be treated with non-narcotics and other non-addictive medicines.  Allergic reactions or negative side effects that may be reported by a patient to such medications will not typically lead to the use of a narcotic analgesic or other controlled substance as an alternative.   Patients managing chronic pain with a personal physician should have provisions in place for breakthrough pain.  If you are in crisis, you should call your physician.  If your physician directs you to the  emergency department, please have the doctor call and speak to our attending physician concerning your care.   When patients come to the Emergency Department (ED) with acute medical conditions in which the Emergency Department physician feels appropriate to prescribe narcotic or sedating pain medication, the physician will prescribe these in very limited quantities.  The amount of these medications will last only until you can see your primary care physician in his/her office.  Any patient who returns to the ED seeking refills should expect only non-narcotic pain medications.   In the event of an acute medical condition exists and the emergency physician feels it is necessary that the patient be given a narcotic or sedating medication -  a responsible adult driver should be present in the room prior to the medication being given by the nurse.   Prescriptions for narcotic or sedating medications that have been lost, stolen or expired will not be refilled in the Emergency Department.  Patients who have chronic pain may receive non-narcotic prescriptions until seen by their primary care physician.  It is every patients personal responsibility to maintain active prescriptions with his or her primary care physician or specialist.

## 2013-03-31 NOTE — ED Notes (Signed)
Pt from home reports lower back pain x3 days. Pt denies injury and not sure why her back is hurting. Pt denies urinary issues. Pt is A&O and in NAD

## 2013-04-03 NOTE — ED Provider Notes (Signed)
Medical screening examination/treatment/procedure(s) were performed by non-physician practitioner and as supervising physician I was immediately available for consultation/collaboration.  EKG Interpretation   None         Brody Bonneau H Lawrnce Reyez, MD 04/03/13 1517 

## 2013-08-28 ENCOUNTER — Emergency Department (HOSPITAL_COMMUNITY)
Admission: EM | Admit: 2013-08-28 | Discharge: 2013-08-28 | Disposition: A | Discharge status: Home / Self Care | Payer: Medicaid Other | Attending: Emergency Medicine | Admitting: Emergency Medicine

## 2013-08-28 ENCOUNTER — Encounter (HOSPITAL_COMMUNITY): Payer: Self-pay | Admitting: Emergency Medicine

## 2013-08-28 DIAGNOSIS — F17200 Nicotine dependence, unspecified, uncomplicated: Secondary | ICD-10-CM | POA: Diagnosis not present

## 2013-08-28 DIAGNOSIS — K089 Disorder of teeth and supporting structures, unspecified: Secondary | ICD-10-CM | POA: Insufficient documentation

## 2013-08-28 DIAGNOSIS — Z79899 Other long term (current) drug therapy: Secondary | ICD-10-CM | POA: Diagnosis not present

## 2013-08-28 DIAGNOSIS — K0889 Other specified disorders of teeth and supporting structures: Secondary | ICD-10-CM

## 2013-08-28 DIAGNOSIS — I10 Essential (primary) hypertension: Secondary | ICD-10-CM | POA: Diagnosis not present

## 2013-08-28 MED ORDER — PENICILLIN V POTASSIUM 500 MG PO TABS: 500.0000 mg | ORAL_TABLET | Freq: Four times a day (QID) | ORAL | Status: DC

## 2013-08-28 MED ORDER — HYDROCODONE-ACETAMINOPHEN 5-325 MG PO TABS: 1.0000 | ORAL_TABLET | Freq: Four times a day (QID) | ORAL | Status: DC | PRN

## 2013-08-28 MED ORDER — HYDROCODONE-ACETAMINOPHEN 5-325 MG PO TABS
2.0000 | ORAL_TABLET | Freq: Once | ORAL | Status: AC
  Administered 2013-08-28: 2 via ORAL
  Filled 2013-08-28: qty 2

## 2013-08-28 NOTE — ED Provider Notes (Signed)
CSN: [id number]     Arrival date & time 08/28/13  1811 History  This chart was scribed for non-physician provider Roxy Horsemanobert Spiros Greenfeld, PA-C, working with Toy BakerAnthony T Allen, MD by Phillis HaggisGabriella Gaje, ED Scribe. This patient was seen in room WTR8/WTR8 and patient care was started at 6:38 PM.     Chief Complaint  Patient presents with  . Dental Pain   The history is provided by the patient. No language interpreter was used.   HPI Comments: [name] is a 33 y.o. female who presents to the Emergency Department complaining of throbbing right upper premolar dental pain onset one day ago. She states that she was told she had something under her tooth so she was trying to get something out of it, when she believes she may have chipped it. She reports right maxillofacial pain associated with her dental pain. She reports that she has been tasting blood in her mouth. She states that she has not yet been to the dentist, but has an appointment next week. She denies fever, chills, nausea and vomiting. She denies allergies to medications.    Past Medical History  Diagnosis Date  . Hypertension    History reviewed. No pertinent past surgical history. No family history on file. History  Substance Use Topics  . Smoking status: Current Every Day Smoker  . Smokeless tobacco: Not on file  . Alcohol Use: 0.6 oz/week    1 Glasses of wine per week   OB History   Grav Para Term Preterm Abortions TAB SAB Ect Mult Living                 Review of Systems  Constitutional: Negative for fever and chills.  HENT: Positive for dental problem.   Gastrointestinal: Negative for nausea and vomiting.  Psychiatric/Behavioral: Positive for sleep disturbance.      Allergies  Review of patient's allergies indicates no known allergies.  Home Medications   Prior to Admission medications   Medication Sig Start Date End Date Taking? Authorizing Provider  Aspirin-Acetaminophen (GOODY BODY PAIN) 500-325 MG PACK Take 1  packet by mouth every 6 (six) hours as needed (pain).    Historical Provider, MD  HYDROcodone-acetaminophen (NORCO) 5-325 MG per tablet Take 1-2 tablets by mouth every 6 (six) hours as needed for moderate pain. 03/31/13   Arthor CaptainAbigail Harris, PA-C  levonorgestrel-ethinyl estradiol (SEASONALE,INTROVALE,JOLESSA) 0.15-0.03 MG tablet Take 1 tablet by mouth daily.    Historical Provider, MD  LORazepam (ATIVAN) 1 MG tablet Take 1 tablet (1 mg total) by mouth 3 (three) times daily as needed for anxiety. 03/31/13   Arthor CaptainAbigail Harris, PA-C   BP 108/81  Pulse 92  Temp(Src) 98.5 F (36.9 C) (Oral)  Resp 16  SpO2 98%  LMP 08/19/2013 Physical Exam  Nursing note and vitals reviewed. Constitutional: She is oriented to person, place, and time. She appears well-developed and well-nourished.  HENT:  Head: Normocephalic and atraumatic.  Mouth/Throat:    Poor dentition throughout.  Affected tooth as diagrammed.  No signs of peritonsillar or tonsillar abscess.  No signs of gingival abscess. Oropharynx is clear and without exudates.  Uvula is midline.  Airway is intact. No signs of Ludwig's angina with palpation of oral and sublingual mucosa.   Eyes: Conjunctivae and EOM are normal.  Neck: Normal range of motion. Neck supple.  Cardiovascular: Normal rate.   Pulmonary/Chest: Effort normal.  Abdominal: She exhibits no distension.  Musculoskeletal: Normal range of motion.  Neurological: She is alert and oriented to person,  place, and time.  Skin: Skin is warm and dry.  Psychiatric: She has a normal mood and affect. Her behavior is normal. Judgment and thought content normal.    ED Course  Procedures (including critical care time) DIAGNOSTIC STUDIES: Oxygen Saturation is 98% on room air, normal by my interpretation.    COORDINATION OF CARE: 6:42 PM-Discussed treatment plan which includes pain medication and antibiotics and f/u with dentist with pt at bedside and pt agreed to plan.   Labs Review Labs Reviewed -  No data to display  Imaging Review No results found.   EKG Interpretation None      MDM   Final diagnoses:  Pain, dental    Patient with toothache.  No gross abscess.  Exam unconcerning for Ludwig's angina or spread of infection.  Will treat with penicillin and pain medicine.  Urged patient to follow-up with dentist.    I personally performed the services described in this documentation, which was scribed in my presence. The recorded information has been reviewed and is accurate.     Roxy Horsemanobert Gelena Klosinski, PA-C 08/28/13 [phone]

## 2013-08-28 NOTE — Discharge Instructions (Signed)

## 2013-08-28 NOTE — ED Notes (Signed)
Pt A+Ox4, reports R upper dental pain since yesterday.  Pt reports "i have a cavity and i was trying to get something out of it and i think i hit something".  9/10 pain.  Pt denies other complaints.  Skin PWD.  Speaking full/clear sentences.  NAD.

## 2013-08-29 NOTE — ED Provider Notes (Signed)
Medical screening examination/treatment/procedure(s) were performed by non-physician practitioner and as supervising physician I was immediately available for consultation/collaboration.  Derrick Tiegs T Alvia Tory, MD 08/29/13 1643 

## 2013-11-30 ENCOUNTER — Emergency Department (HOSPITAL_COMMUNITY)
Admission: EM | Admit: 2013-11-30 | Discharge: 2013-11-30 | Disposition: A | Discharge status: Home / Self Care | Payer: Medicaid Other | Attending: Emergency Medicine | Admitting: Emergency Medicine

## 2013-11-30 ENCOUNTER — Encounter (HOSPITAL_COMMUNITY): Payer: Self-pay | Admitting: Emergency Medicine

## 2013-11-30 DIAGNOSIS — I10 Essential (primary) hypertension: Secondary | ICD-10-CM | POA: Insufficient documentation

## 2013-11-30 DIAGNOSIS — M5410 Radiculopathy, site unspecified: Secondary | ICD-10-CM | POA: Diagnosis not present

## 2013-11-30 DIAGNOSIS — M792 Neuralgia and neuritis, unspecified: Secondary | ICD-10-CM

## 2013-11-30 DIAGNOSIS — Z79899 Other long term (current) drug therapy: Secondary | ICD-10-CM | POA: Diagnosis not present

## 2013-11-30 DIAGNOSIS — Z792 Long term (current) use of antibiotics: Secondary | ICD-10-CM | POA: Diagnosis not present

## 2013-11-30 DIAGNOSIS — Z720 Tobacco use: Secondary | ICD-10-CM | POA: Diagnosis not present

## 2013-11-30 DIAGNOSIS — M545 Low back pain: Secondary | ICD-10-CM | POA: Diagnosis present

## 2013-11-30 MED ORDER — METHOCARBAMOL 500 MG PO TABS: 500.0000 mg | ORAL_TABLET | Freq: Two times a day (BID) | ORAL | Status: DC

## 2013-11-30 MED ORDER — KETOROLAC TROMETHAMINE 30 MG/ML IJ SOLN
30.0000 mg | Freq: Once | INTRAMUSCULAR | Status: AC
  Administered 2013-11-30: 30 mg via INTRAMUSCULAR
  Filled 2013-11-30: qty 1

## 2013-11-30 MED ORDER — NAPROXEN 500 MG PO TABS: 500.0000 mg | ORAL_TABLET | Freq: Two times a day (BID) | ORAL | Status: DC

## 2013-11-30 MED ORDER — METHOCARBAMOL 500 MG PO TABS
500.0000 mg | ORAL_TABLET | Freq: Once | ORAL | Status: AC
  Administered 2013-11-30: 500 mg via ORAL
  Filled 2013-11-30: qty 1

## 2013-11-30 NOTE — ED Provider Notes (Signed)
CSN: [id number]     Arrival date & time 11/30/13  1949 History   First MD Initiated Contact with Patient 11/30/13 1958     Chief Complaint  Patient presents with  . Back Pain  . Arm numbness      (Consider location/radiation/quality/duration/timing/severity/associated sxs/prior Treatment) HPI  33 year old female presents complaining of upper back pain and left arm pain.  Patient states she works at [employer]. She was working yesterday and has to lift a heavy tea cannister at least 7 or 8 times. She went home and took a nap, when she woke up she reports having achy pain to the neck which radiates down the left shoulder and arm. She also report to me numbness sensation throughout her left arm with some muscle spasm and weakness. Pain worsening with laying on affected arm, but raising the arm above her shoulder does help with the pain.  She tries taking ibuprofen with minimal relief. No complaints of double vision, vision changes, headache, chest pain, shortness of breath, or rash. Denies any specific trauma. Denies any lightheadedness or dizziness. No history of MS.  Pt is RHD.  Past Medical History  Diagnosis Date  . Hypertension    History reviewed. No pertinent past surgical history. No family history on file. History  Substance Use Topics  . Smoking status: Current Every Day Smoker  . Smokeless tobacco: Not on file  . Alcohol Use: 0.6 oz/week    1 Glasses of wine per week     Comment: occasionally    OB History   Grav Para Term Preterm Abortions TAB SAB Ect Mult Living                 Review of Systems  Constitutional: Negative for fever.  Musculoskeletal: Positive for myalgias.  Skin: Negative for rash.  Neurological: Positive for numbness.      Allergies  Hydrocodone  Home Medications   Prior to Admission medications   Medication Sig Start Date End Date Taking? Authorizing Provider  amoxicillin (AMOXIL) 500 MG capsule Take 500 mg by mouth 3 (three) times daily.  11/11/13  Yes Historical Provider, MD  ibuprofen (ADVIL,MOTRIN) 200 MG tablet Take 800 mg by mouth every 6 (six) hours as needed for moderate pain.   Yes Historical Provider, MD  levonorgestrel-ethinyl estradiol (SEASONALE,INTROVALE,JOLESSA) 0.15-0.03 MG tablet Take 1 tablet by mouth daily.   Yes Historical Provider, MD   BP 144/95  Pulse 98  Temp(Src) 98.6 F (37 C) (Oral)  Resp 16  Ht 5\' 4"  (1.626 m)  Wt 120 lb (54.432 kg)  BMI 20.59 kg/m2  SpO2 100%  LMP 11/23/2013 Physical Exam  Nursing note and vitals reviewed. Constitutional: She appears well-developed and well-nourished. No distress.  HENT:  Head: Atraumatic.  Eyes: Conjunctivae are normal.  Neck: Normal range of motion. Neck supple. No JVD present.  No significant cervical midline spine tenderness, crepitus, step-off. Normal neck range of motion  Cardiovascular: Normal rate and regular rhythm.   Pulmonary/Chest: Effort normal and breath sounds normal.  Musculoskeletal: She exhibits tenderness (Mild generalized tenderness along the left trapezius, left shoulder blade, and left arm without focal point tenderness, deformity, or overlying skin changes.). She exhibits no edema.  Neurological: She is alert. She has normal strength. No cranial nerve deficit. GCS eye subscore is 4. GCS verbal subscore is 5. GCS motor subscore is 6.  Mild subjective paresthesia along over ulnar distribution of the left arm as compared to right. Normal grip strength with 5 out of 5 strength  to bilateral upper extremities. Radial pulse 2+ with brisk cap refill.    Skin: No rash noted.  Psychiatric: She has a normal mood and affect.    ED Course  Procedures (including critical care time)  8:51 PM Patient here with radicular pain to the left upper extremities. This is likely secondary to heavy lifting at work. I have low suspicion for multiple sclerosis, or cord compression.  Aside from mild neuropraxia, patient is neurovascularly intact. She has  normal strength to BLE.  Does take birth control pill but pt is not hypoxic, no pleuritic cp, no edema noted.    9:30 PM Report improvement of sxs with toradol and muscle relaxant.  I recommend RICE, watchful waiting for improvement, if sxs persist to return to ensure no PE.  Neurology referral if numbness persists.  Care discussed with Dr. Fredderick PhenixBelfi.  Labs Review Labs Reviewed - No data to display  Imaging Review No results found.   EKG Interpretation None      MDM   Final diagnoses:  Radicular pain in left arm    BP 112/94  Pulse 86  Temp(Src) 98.6 F (37 C) (Oral)  Resp 21  Ht 5\' 4"  (1.626 m)  Wt 120 lb (54.432 kg)  BMI 20.59 kg/m2  SpO2 100%  LMP 11/23/2013     Crystal HelperBowie July Nickson, PA-C 11/30/13 2138

## 2013-11-30 NOTE — ED Provider Notes (Signed)
Medical screening examination/treatment/procedure(s) were performed by non-physician practitioner and as supervising physician I was immediately available for consultation/collaboration.   EKG Interpretation None        Sharunda Salmon, MD 11/30/13 2305 

## 2013-11-30 NOTE — ED Notes (Addendum)
Pt presents with c/o upper back pain and left arm pain and numbness that started last night. Pt denies any injury to that area, no shortness of breath, no dizziness, no chest pain at this time. Pt is tearful in triage.

## 2013-11-30 NOTE — Discharge Instructions (Signed)
Take pain medication and muscle relaxant for your pain.  If numbness persists, please [facility] for [facility].  If you develop arm swelling, trouble breathing, or chest pain please return to ER for further care.    Neuropathic Pain We often think that pain has a physical cause. If we get rid of the cause, the pain should go away. Nerves themselves can also cause pain. It is called neuropathic pain, which means nerve abnormality. It may be difficult for the patients who have it and for the treating caregivers. Pain is usually described as acute (short-lived) or chronic (long-lasting). Acute pain is related to the physical sensations caused by an injury. It can last from a few seconds to many weeks, but it usually goes away when normal healing occurs. Chronic pain lasts beyond the typical healing time. With neuropathic pain, the nerve fibers themselves may be damaged or injured. They then send incorrect signals to other pain centers. The pain you feel is real, but the cause is not easy to find.  CAUSES  Chronic pain can result from diseases, such as diabetes and shingles (an infection related to chickenpox), or from trauma, surgery, or amputation. It can also happen without any known injury or disease. The nerves are sending pain messages, even though there is no identifiable cause for such messages.   Other common causes of neuropathy include diabetes, phantom limb pain, or Regional Pain Syndrome (RPS).  As with all forms of chronic back pain, if neuropathy is not correctly treated, there can be a number of associated problems that lead to a downward cycle for the patient. These include depression, sleeplessness, feelings of fear and anxiety, limited social interaction and inability to do normal daily activities or work.  The most dramatic and mysterious example of neuropathic pain is called "phantom limb syndrome." This occurs when an arm or a leg has been removed because  of illness or injury. The brain still gets pain messages from the nerves that originally carried impulses from the missing limb. These nerves now seem to misfire and cause troubling pain.  Neuropathic pain often seems to have no cause. It responds poorly to standard pain treatment. Neuropathic pain can occur after:  Shingles (herpes zoster virus infection).  A lasting burning sensation of the skin, caused usually by injury to a peripheral nerve.  Peripheral neuropathy which is widespread nerve damage, often caused by diabetes or alcoholism.  Phantom limb pain following an amputation.  Facial nerve problems (trigeminal neuralgia).  Multiple sclerosis.  Reflex sympathetic dystrophy.  Pain which comes with cancer and cancer chemotherapy.  Entrapment neuropathy such as when pressure is put on a nerve such as in carpal tunnel syndrome.  Back, leg, and hip problems (sciatica).  Spine or back surgery.  HIV Infection or AIDS where nerves are infected by viruses. Your caregiver can explain items in the above list which may apply to you. SYMPTOMS  Characteristics of neuropathic pain are:  Severe, sharp, electric shock-like, shooting, lightening-like, knife-like.  Pins and needles sensation.  Deep burning, deep cold, or deep ache.  Persistent numbness, tingling, or weakness.  Pain resulting from light touch or other stimulus that would not usually cause pain.  Increased sensitivity to something that would normally cause pain, such as a pinprick. Pain may persist for months or years following the healing of damaged tissues. When this happens, pain signals no longer sound an alarm about current injuries or injuries about to happen. Instead, the alarm system itself  is not working correctly.  Neuropathic pain may get worse instead of better over time. For some people, it can lead to serious disability. It is important to be aware that severe injury in a limb can occur without a proper,  protective pain response.Burns, cuts, and other injuries may go unnoticed. Without proper treatment, these injuries can become infected or lead to further disability. Take any injury seriously, and consult your caregiver for treatment. DIAGNOSIS  When you have a pain with no known cause, your caregiver will probably ask some specific questions:   Do you have any other conditions, such as diabetes, shingles, multiple sclerosis, or HIV infection?  How would you describe your pain? (Neuropathic pain is often described as shooting, stabbing, burning, or searing.)  Is your pain worse at any time of the day? (Neuropathic pain is usually worse at night.)  Does the pain seem to follow a certain physical pathway?  Does the pain come from an area that has missing or injured nerves? (An example would be phantom limb pain.)  Is the pain triggered by minor things such as rubbing against the sheets at night? These questions often help define the type of pain involved. Once your caregiver knows what is happening, treatment can begin. Anticonvulsant, antidepressant drugs, and various pain relievers seem to work in some cases. If another condition, such as diabetes is involved, better management of that disorder may relieve the neuropathic pain.  TREATMENT  Neuropathic pain is frequently long-lasting and tends not to respond to treatment with narcotic type pain medication. It may respond well to other drugs such as antiseizure and antidepressant medications. Usually, neuropathic problems do not completely go away, but partial improvement is often possible with proper treatment. Your caregivers have large numbers of medications available to treat you. Do not be discouraged if you do not get immediate relief. Sometimes different medications or a combination of medications will be tried before you receive the results you are hoping for. See your caregiver if you have pain that seems to be coming from nowhere and does  not go away. Help is available.  SEEK IMMEDIATE MEDICAL CARE IF:   There is a sudden change in the quality of your pain, especially if the change is on only one side of the body.  You notice changes of the skin, such as redness, black or purple discoloration, swelling, or an ulcer.  You cannot move the affected limbs. Document Released: 11/12/2003 Document Revised: 05/09/2011 Document Reviewed: 11/12/2003 [facility] Patient Information 2015 [place], [place]. This information is not intended to replace advice given to you by your health care provider. Make sure you discuss any questions you have with your health care provider.

## 2013-12-02 ENCOUNTER — Emergency Department (HOSPITAL_COMMUNITY)
Admission: EM | Admit: 2013-12-02 | Discharge: 2013-12-02 | Disposition: A | Discharge status: Home / Self Care | Payer: Medicaid Other | Attending: Emergency Medicine | Admitting: Emergency Medicine

## 2013-12-02 ENCOUNTER — Encounter (HOSPITAL_COMMUNITY): Payer: Self-pay | Admitting: Emergency Medicine

## 2013-12-02 DIAGNOSIS — Z79899 Other long term (current) drug therapy: Secondary | ICD-10-CM | POA: Diagnosis not present

## 2013-12-02 DIAGNOSIS — M79622 Pain in left upper arm: Secondary | ICD-10-CM | POA: Insufficient documentation

## 2013-12-02 DIAGNOSIS — M546 Pain in thoracic spine: Secondary | ICD-10-CM | POA: Insufficient documentation

## 2013-12-02 DIAGNOSIS — M542 Cervicalgia: Secondary | ICD-10-CM | POA: Diagnosis not present

## 2013-12-02 DIAGNOSIS — Z720 Tobacco use: Secondary | ICD-10-CM | POA: Insufficient documentation

## 2013-12-02 DIAGNOSIS — Z792 Long term (current) use of antibiotics: Secondary | ICD-10-CM | POA: Insufficient documentation

## 2013-12-02 DIAGNOSIS — M25512 Pain in left shoulder: Secondary | ICD-10-CM | POA: Insufficient documentation

## 2013-12-02 DIAGNOSIS — I10 Essential (primary) hypertension: Secondary | ICD-10-CM | POA: Insufficient documentation

## 2013-12-02 DIAGNOSIS — M549 Dorsalgia, unspecified: Secondary | ICD-10-CM

## 2013-12-02 DIAGNOSIS — M79602 Pain in left arm: Secondary | ICD-10-CM

## 2013-12-02 MED ORDER — OXYCODONE-ACETAMINOPHEN 5-325 MG PO TABS: 1.0000 | ORAL_TABLET | ORAL | Status: DC | PRN

## 2013-12-02 MED ORDER — OXYCODONE-ACETAMINOPHEN 5-325 MG PO TABS
2.0000 | ORAL_TABLET | Freq: Once | ORAL | Status: AC
  Administered 2013-12-02: 2 via ORAL
  Filled 2013-12-02: qty 2

## 2013-12-02 MED ORDER — DIAZEPAM 5 MG PO TABS
5.0000 mg | ORAL_TABLET | Freq: Two times a day (BID) | ORAL | Status: DC
Start: 2013-12-02 — End: 2014-10-12

## 2013-12-02 NOTE — ED Notes (Signed)
Pt c/o upper left arm pain that started Friday.  Pt states that she was seen here Saturday and the pain has gotten worse instead of better.

## 2013-12-02 NOTE — ED Provider Notes (Signed)
CSN: [id number]     Arrival date & time 12/02/13  1340 History  This chart was scribed for non-physician practitioner, Sharilyn SitesLisa Olumide Dolinger, PA-C working with Gilda Creasehristopher J. Pollina, MD by Greggory StallionKayla Andersen, ED scribe. This patient was seen in room WTR8/WTR8 and the patient's care was started at 2:18 PM.   Chief Complaint  Patient presents with  . Arm Pain  . Back Pain   The history is provided by the patient. No language interpreter was used.   HPI Comments: [name] is a 33 y.o. female who presents to the Emergency Department complaining of left upper arm pain and upper back pain that started 3 days ago after repetitive heavy lifting at work. Pt was evaluated for the same 2 days ago and discharged with naproxen and robaxin and told to return to the ED if symptoms persisted. Medications have provided no relief and states pain is now worsening. Denies new fall, injury or heavy lifting. Denies fever, chest pain, trouble breathing, SOB, diaphoresis, dizziness, weakness, numbness or tingling in arm.  Patient has no prior cardiac hx.  She is a daily smoker.  VS stable on arrival.  Past Medical History  Diagnosis Date  . Hypertension    History reviewed. No pertinent past surgical history. No family history on file. History  Substance Use Topics  . Smoking status: Current Every Day Smoker  . Smokeless tobacco: Not on file  . Alcohol Use: 0.6 oz/week    1 Glasses of wine per week     Comment: occasionally    OB History   Grav Para Term Preterm Abortions TAB SAB Ect Mult Living                 Review of Systems  Constitutional: Negative for fever.  Respiratory: Negative for shortness of breath.   Cardiovascular: Negative for chest pain.  Musculoskeletal: Positive for myalgias.  Neurological: Negative for numbness.  All other systems reviewed and are negative.  Allergies  Hydrocodone  Home Medications   Prior to Admission medications   Medication Sig Start Date End Date Taking?  Authorizing Provider  amoxicillin (AMOXIL) 500 MG capsule Take 500 mg by mouth 3 (three) times daily. 11/11/13   Historical Provider, MD  ibuprofen (ADVIL,MOTRIN) 200 MG tablet Take 800 mg by mouth every 6 (six) hours as needed for moderate pain.    Historical Provider, MD  levonorgestrel-ethinyl estradiol (SEASONALE,INTROVALE,JOLESSA) 0.15-0.03 MG tablet Take 1 tablet by mouth daily.    Historical Provider, MD  methocarbamol (ROBAXIN) 500 MG tablet Take 1 tablet (500 mg total) by mouth 2 (two) times daily. 11/30/13   Fayrene HelperBowie Tran, PA-C  naproxen (NAPROSYN) 500 MG tablet Take 1 tablet (500 mg total) by mouth 2 (two) times daily. 11/30/13   Fayrene HelperBowie Tran, PA-C   BP 135/91  Pulse 88  Temp(Src) 99 F (37.2 C) (Oral)  Resp 17  SpO2 100%  LMP 11/23/2013  Physical Exam  Nursing note and vitals reviewed. Constitutional: She is oriented to person, place, and time. She appears well-developed and well-nourished.  HENT:  Head: Normocephalic and atraumatic.  Mouth/Throat: Oropharynx is clear and moist.  Eyes: Conjunctivae and EOM are normal. Pupils are equal, round, and reactive to light.  Neck: Normal range of motion.  Cardiovascular: Normal rate, regular rhythm and normal heart sounds.   Pulmonary/Chest: Effort normal and breath sounds normal.  Abdominal: Soft. Bowel sounds are normal.  Musculoskeletal: Normal range of motion.       Cervical back: She exhibits tenderness, pain  and spasm.  Tenderness noted on left trapezius; LUE without noted deformities, swelling, erythema, or warmth to touch; full ROM of left shoulder, elbow, wrist, hand, and all fingers without difficulty; strong radial pulse and cap refill; sensation intact diffusely throughout arm  Neurological: She is alert and oriented to person, place, and time.  Skin: Skin is warm and dry.  Psychiatric: She has a normal mood and affect.    ED Course  Procedures (including critical care time)  DIAGNOSTIC STUDIES: Oxygen Saturation is 100%  on RA, normal by my interpretation.    COORDINATION OF CARE: 2:20 PM-Discussed treatment plan which includes pain medication with pt at bedside and pt agreed to plan.   Labs Review Labs Reviewed - No data to display  Imaging Review No results found.   EKG Interpretation None      MDM   Final diagnoses:  Back pain, unspecified location  Left arm pain   33 year old female with left arm and upper back pain. On exam, she has noted tenderness to her left trapezius. No bony deformities of the left arm or clinical signs of DVT. She denies any chest pain, shortness of breath, palpitations, dizziness, or weakness. Patient has no prior cardiac history, she is a daily smoker.  Patient was taking naproxen and Robaxin at home, no noted improvement. She was given 2 Percocet in the ED today with some improvement of her symptoms. I offered additional medication and testing, however patient states she just wants to go home and rest. Previous note he did mention possibility of PE give her use of OCP's, however she is low risk for this per Wells criteria. Low suspicion for atypical ACS as well as patient's pain is reproducible with palpation and she has no chest pain or shortness of breath. Vital signs have remained stable in the ED.  Will discharge home with percocet and valium.  Encouraged to FU at [facility] for ongoing sx.  Discussed plan with patient, he/she acknowledged understanding and agreed with plan of care.  Return precautions given for new or worsening symptoms.  I personally performed the services described in this documentation, which was scribed in my presence. The recorded information has been reviewed and is accurate.  Garlon Hatchet, PA-C 12/02/13 [phone]

## 2013-12-02 NOTE — Discharge Instructions (Signed)
Take the prescribed medication as directed.  Discontinue taking medications from prior visit. Follow-up with the [facility]. Return to the ED for new or worsening symptoms.

## 2013-12-03 NOTE — ED Provider Notes (Signed)
Medical screening examination/treatment/procedure(s) were performed by non-physician practitioner and as supervising physician I was immediately available for consultation/collaboration.   Nawal Burling J. Alixandrea Milleson, MD 12/03/13 0712 

## 2013-12-04 ENCOUNTER — Encounter (HOSPITAL_COMMUNITY): Payer: Self-pay | Admitting: Emergency Medicine

## 2013-12-04 ENCOUNTER — Emergency Department (INDEPENDENT_AMBULATORY_CARE_PROVIDER_SITE_OTHER)
Admission: EM | Admit: 2013-12-04 | Discharge: 2013-12-04 | Disposition: A | Discharge status: Home / Self Care | Payer: Medicaid Other | Source: Home / Self Care | Attending: Family Medicine | Admitting: Family Medicine

## 2013-12-04 DIAGNOSIS — S46912A Strain of unspecified muscle, fascia and tendon at shoulder and upper arm level, left arm, initial encounter: Secondary | ICD-10-CM

## 2013-12-04 MED ORDER — CYCLOBENZAPRINE HCL 5 MG PO TABS: 5.0000 mg | ORAL_TABLET | Freq: Three times a day (TID) | ORAL | Status: DC | PRN

## 2013-12-04 MED ORDER — KETOROLAC TROMETHAMINE 30 MG/ML IJ SOLN
30.0000 mg | Freq: Once | INTRAMUSCULAR | Status: AC
  Administered 2013-12-04: 30 mg via INTRAMUSCULAR

## 2013-12-04 MED ORDER — KETOROLAC TROMETHAMINE 10 MG PO TABS: 10.0000 mg | ORAL_TABLET | Freq: Four times a day (QID) | ORAL | Status: DC | PRN

## 2013-12-04 MED ORDER — KETOROLAC TROMETHAMINE 30 MG/ML IJ SOLN
INTRAMUSCULAR | Status: AC
  Filled 2013-12-04: qty 1

## 2013-12-04 NOTE — ED Provider Notes (Signed)
CSN: [id number]     Arrival date & time 12/04/13  1315 History   First MD Initiated Contact with Patient 12/04/13 1507     Chief Complaint  Patient presents with  . Arm Pain   (Consider location/radiation/quality/duration/timing/severity/associated sxs/prior Treatment) Patient is a 33 y.o. female presenting with arm pain. The history is provided by the patient.  Arm Pain This is a recurrent problem. The current episode started more than 2 days ago (seen twice in ER this week without relief of sx, pt here for opinion.). The problem has not changed since onset.   Past Medical History  Diagnosis Date  . Hypertension    History reviewed. No pertinent past surgical history. History reviewed. No pertinent family history. History  Substance Use Topics  . Smoking status: Current Every Day Smoker  . Smokeless tobacco: Not on file  . Alcohol Use: 0.6 oz/week    1 Glasses of wine per week     Comment: occasionally    OB History   Grav Para Term Preterm Abortions TAB SAB Ect Mult Living                 Review of Systems  Constitutional: Negative.   Cardiovascular: Negative.   Musculoskeletal: Positive for back pain and myalgias.  Skin: Negative.     Allergies  Hydrocodone  Home Medications   Prior to Admission medications   Medication Sig Start Date End Date Taking? Authorizing Provider  amoxicillin (AMOXIL) 500 MG capsule Take 500 mg by mouth 3 (three) times daily. 11/11/13   Historical Provider, MD  cyclobenzaprine (FLEXERIL) 5 MG tablet Take 1 tablet (5 mg total) by mouth 3 (three) times daily as needed for muscle spasms. 12/04/13   Linna HoffJames D Kindl, MD  diazepam (VALIUM) 5 MG tablet Take 1 tablet (5 mg total) by mouth 2 (two) times daily. 12/02/13   Garlon HatchetLisa M Sanders, PA-C  ibuprofen (ADVIL,MOTRIN) 200 MG tablet Take 800 mg by mouth every 6 (six) hours as needed for moderate pain.    Historical Provider, MD  ketorolac (TORADOL) 10 MG tablet Take 1 tablet (10 mg total) by mouth every  6 (six) hours as needed. For pain 12/04/13   Linna HoffJames D Kindl, MD  levonorgestrel-ethinyl estradiol (SEASONALE,INTROVALE,JOLESSA) 0.15-0.03 MG tablet Take 1 tablet by mouth daily.    Historical Provider, MD  methocarbamol (ROBAXIN) 500 MG tablet Take 1 tablet (500 mg total) by mouth 2 (two) times daily. 11/30/13   Fayrene HelperBowie Tran, PA-C  naproxen (NAPROSYN) 500 MG tablet Take 1 tablet (500 mg total) by mouth 2 (two) times daily. 11/30/13   Fayrene HelperBowie Tran, PA-C  oxyCODONE-acetaminophen (PERCOCET/ROXICET) 5-325 MG per tablet Take 1 tablet by mouth every 4 (four) hours as needed. 12/02/13   Garlon HatchetLisa M Sanders, PA-C   BP 121/83  Pulse 90  Temp(Src) 98.5 F (36.9 C) (Oral)  SpO2 100%  LMP 11/23/2013 Physical Exam  Nursing note and vitals reviewed. Constitutional: She is oriented to person, place, and time. She appears well-developed and well-nourished.  Neck: Normal range of motion. Neck supple.  Pulmonary/Chest: Effort normal and breath sounds normal. She exhibits tenderness.  Musculoskeletal: She exhibits tenderness.       Arms: Neurological: She is alert and oriented to person, place, and time.  Skin: Skin is warm and dry.    ED Course  Procedures (including critical care time) Labs Review Labs Reviewed - No data to display  Imaging Review No results found.   MDM   1. Muscle strain of scapular  region, left, initial encounter        Linna Hoff, MD 12/04/13 1627

## 2013-12-04 NOTE — ED Notes (Signed)
C/o pain in left arm/shoulder since Friday. Had been to ED 10-3 and 10-5 w same c/o . Has not gotten any better. 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

## 2013-12-04 NOTE — Discharge Instructions (Signed)
Heat, sling and medicine as needed, work note as given,

## 2014-06-11 ENCOUNTER — Emergency Department (HOSPITAL_COMMUNITY)
Admission: EM | Admit: 2014-06-11 | Discharge: 2014-06-11 | Disposition: A | Discharge status: Home / Self Care | Payer: Medicaid Other | Attending: Emergency Medicine | Admitting: Emergency Medicine

## 2014-06-11 ENCOUNTER — Emergency Department (HOSPITAL_COMMUNITY): Payer: Medicaid Other

## 2014-06-11 ENCOUNTER — Encounter (HOSPITAL_COMMUNITY): Payer: Self-pay

## 2014-06-11 DIAGNOSIS — J309 Allergic rhinitis, unspecified: Secondary | ICD-10-CM | POA: Diagnosis not present

## 2014-06-11 DIAGNOSIS — R5383 Other fatigue: Secondary | ICD-10-CM | POA: Diagnosis not present

## 2014-06-11 DIAGNOSIS — J039 Acute tonsillitis, unspecified: Secondary | ICD-10-CM | POA: Diagnosis not present

## 2014-06-11 DIAGNOSIS — I10 Essential (primary) hypertension: Secondary | ICD-10-CM | POA: Diagnosis not present

## 2014-06-11 DIAGNOSIS — Z720 Tobacco use: Secondary | ICD-10-CM | POA: Diagnosis not present

## 2014-06-11 DIAGNOSIS — J029 Acute pharyngitis, unspecified: Secondary | ICD-10-CM | POA: Diagnosis present

## 2014-06-11 DIAGNOSIS — Z79899 Other long term (current) drug therapy: Secondary | ICD-10-CM | POA: Diagnosis not present

## 2014-06-11 DIAGNOSIS — Z791 Long term (current) use of non-steroidal anti-inflammatories (NSAID): Secondary | ICD-10-CM | POA: Insufficient documentation

## 2014-06-11 DIAGNOSIS — G479 Sleep disorder, unspecified: Secondary | ICD-10-CM | POA: Insufficient documentation

## 2014-06-11 LAB — RAPID STREP SCREEN (MED CTR MEBANE ONLY): Streptococcus, Group A Screen (Direct): NEGATIVE

## 2014-06-11 MED ORDER — GUAIFENESIN-CODEINE 100-10 MG/5ML PO SOLN: 5.0000 mL | Freq: Three times a day (TID) | ORAL | Status: DC | PRN

## 2014-06-11 MED ORDER — PENICILLIN V POTASSIUM 500 MG PO TABS: 500.0000 mg | ORAL_TABLET | Freq: Three times a day (TID) | ORAL | Status: DC

## 2014-06-11 NOTE — ED Notes (Signed)
Pt with sore throat and cough x 3 days.  No fever.

## 2014-06-11 NOTE — ED Provider Notes (Signed)
CSN: [id number]     Arrival date & time 06/11/14  1631 History  This chart was scribed for non-physician practitioner, Marlon Pel, PA-C working with Purvis Sheffield, MD by Angelene Giovanni, ED Scribe. The patient was seen in room WTR7/WTR7 and the patient's care was started at 6:44 PM    Chief Complaint  Patient presents with  . Sore Throat  . Hemoptysis   The history is provided by the patient. No language interpreter was used.   HPI Comments: [name] is a 34 y.o. female who presents to the Emergency Department complaining of sore throat, dry cough, and neck pain onset 2 weeks ago. She denies any fever. She reports that the dry cough has kept her up for the past 2 nights and the neck hurts when she pushes on a "bump" on the left side. She has tried several different OTC medications for sore throat and cough with no improvement. She would like something to help her stop coughing and sleep.. The patient denies headaches, neck pain, weakness, vision changes, severe abdominal pain, inability to eat or drink, difficulty breathing, SOB, wheezing, chest pain. The patient has tried cough medicine, NSAIDS, and rest but has only felt mild relief.      Past Medical History  Diagnosis Date  . Hypertension    History reviewed. No pertinent past surgical history. History reviewed. No pertinent family history. History  Substance Use Topics  . Smoking status: Current Every Day Smoker  . Smokeless tobacco: Not on file  . Alcohol Use: 0.6 oz/week    1 Glasses of wine per week     Comment: occasionally    OB History    No data available     Review of Systems  Constitutional: Positive for fatigue. Negative for fever.  HENT: Positive for sore throat and trouble swallowing. Negative for congestion, ear pain, facial swelling, mouth sores and postnasal drip.   Respiratory: Positive for cough. Negative for chest tightness and wheezing.   Gastrointestinal: Negative for nausea.   Allergic/Immunologic: Positive for environmental allergies.  Neurological: Negative for dizziness.  Psychiatric/Behavioral: Positive for sleep disturbance.      Allergies  Hydrocodone  Home Medications   Prior to Admission medications   Medication Sig Start Date End Date Taking? Authorizing Provider  levonorgestrel-ethinyl estradiol (SEASONALE,INTROVALE,JOLESSA) 0.15-0.03 MG tablet Take 1 tablet by mouth daily.   Yes Historical Provider, MD  pseudoephedrine (SUDAFED) 30 MG tablet Take 30 mg by mouth every 6 (six) hours as needed for congestion (cold symptoms).   Yes Historical Provider, MD  cyclobenzaprine (FLEXERIL) 5 MG tablet Take 1 tablet (5 mg total) by mouth 3 (three) times daily as needed for muscle spasms. Patient not taking: Reported on 06/11/2014 12/04/13   Linna Hoff, MD  diazepam (VALIUM) 5 MG tablet Take 1 tablet (5 mg total) by mouth 2 (two) times daily. Patient not taking: Reported on 06/11/2014 12/02/13   Garlon Hatchet, PA-C  guaiFENesin-codeine 100-10 MG/5ML syrup Take 5 mLs by mouth 3 (three) times daily as needed for cough. 06/11/14   Persis Graffius Neva Seat, PA-C  ketorolac (TORADOL) 10 MG tablet Take 1 tablet (10 mg total) by mouth every 6 (six) hours as needed. For pain Patient not taking: Reported on 06/11/2014 12/04/13   Linna Hoff, MD  methocarbamol (ROBAXIN) 500 MG tablet Take 1 tablet (500 mg total) by mouth 2 (two) times daily. Patient not taking: Reported on 06/11/2014 11/30/13   Fayrene Helper, PA-C  naproxen (NAPROSYN) 500 MG tablet Take  1 tablet (500 mg total) by mouth 2 (two) times daily. Patient not taking: Reported on 06/11/2014 11/30/13   Fayrene Helper, PA-C  oxyCODONE-acetaminophen (PERCOCET/ROXICET) 5-325 MG per tablet Take 1 tablet by mouth every 4 (four) hours as needed. Patient not taking: Reported on 06/11/2014 12/02/13   Garlon Hatchet, PA-C  penicillin v potassium (VEETID) 500 MG tablet Take 1 tablet (500 mg total) by mouth 3 (three) times daily. 06/11/14   Jori Frerichs  Neva Seat, PA-C   BP 128/96 mmHg  Pulse 94  Temp(Src) 98.1 F (36.7 C) (Oral)  Resp 18  SpO2 98%  LMP 05/21/2014 Physical Exam  Constitutional: She is oriented to person, place, and time. She appears well-developed and well-nourished. No distress.  HENT:  Head: Normocephalic and atraumatic.  Right Ear: Tympanic membrane and ear canal normal.  Left Ear: Tympanic membrane and ear canal normal.  Mouth/Throat: No trismus in the jaw. No uvula swelling. Posterior oropharyngeal edema present. No oropharyngeal exudate, posterior oropharyngeal erythema or tonsillar abscesses.  Erythematous tonsillar pillars bilaterally. No exudates.   Eyes: Conjunctivae and EOM are normal.  Neck: Neck supple. No tracheal deviation present.  Submandibular lymphadenopathy on L   Cardiovascular: Normal rate, regular rhythm and normal heart sounds.   Pulmonary/Chest: Effort normal. No respiratory distress. She has no wheezes. She has no rales. She exhibits no tenderness.  Abdominal: There is no tenderness.  Musculoskeletal: Normal range of motion.  Neurological: She is alert and oriented to person, place, and time.  Skin: Skin is warm and dry. No rash noted.  Psychiatric: She has a normal mood and affect. Her behavior is normal.  Nursing note and vitals reviewed.   ED Course  Procedures (including critical care time) DIAGNOSTIC STUDIES: Oxygen Saturation is 98% on RA, normal by my interpretation.    COORDINATION OF CARE: 6:49 PM- Pt advised of plan for treatment and pt agrees.    Labs Review Labs Reviewed  RAPID STREP SCREEN  CULTURE, GROUP A STREP    Imaging Review Dg Chest 2 View  06/11/2014   CLINICAL DATA:  Initial encounter for 3 day history sore throat and cough  EXAM: CHEST  2 VIEW  COMPARISON:  Chest x-ray from 05/29/2008.  FINDINGS: The heart size and mediastinal contours are within normal limits. Both lungs are clear. The visualized skeletal structures are unremarkable.  IMPRESSION: No active  cardiopulmonary disease.   Electronically Signed   By: Kennith Center M.D.   On: 06/11/2014 18:06     EKG Interpretation None      MDM   Final diagnoses:  Tonsillitis    The patients throat started hurting this morning. She is here with her son who is 8 and just tested positive for strep. Will treat with Penicillin and cough medications..  34 y.o.Arnette D Valera's evaluation in the Emergency Department is complete. It has been determined that no acute conditions requiring further emergency intervention are present at this time. The patient/guardian have been advised of the diagnosis and plan. We have discussed signs and symptoms that warrant return to the ED, such as changes or worsening in symptoms.  Vital signs are stable at discharge. Filed Vitals:   06/11/14 1645  BP: 128/96  Pulse: 94  Temp: 98.1 F (36.7 C)  Resp: 18    Patient/guardian has voiced understanding and agreed to follow-up with the PCP or specialist.  I personally performed the services described in this documentation, which was scribed in my presence. The recorded information has been reviewed and is  accurate.    Marlon Peliffany Terrionna Bridwell, PA-C 06/11/14 1905  Purvis SheffieldForrest Harrison, MD 06/12/14 [phone]

## 2014-06-11 NOTE — Progress Notes (Signed)
pcp is  [facility] [address] [place] [id number] [phone] or [phone]

## 2014-06-11 NOTE — Discharge Instructions (Signed)
Tonsillectomy  A tonsillectomy is a surgery to remove your tonsils. Tonsils are lymph tissues at the back and upper part of your throat. Because tonsils can collect debris, they can become infected. Tonsillectomy often is done when nonsurgical treatments have been unsuccessful in resolving problems with tonsils.   LET YOUR HEALTH CARE PROVIDER KNOW ABOUT:  · Any allergies you have.  · All medicines you are taking, including vitamins, herbs, eye drops, creams, and over-the-counter medicines, especially those containing aspirin or ibuprofen.  · Previous problems you or members of your family have had with the use of anesthetics.  · Any blood disorders you have.  · Previous surgeries you have had.  · Medical conditions you have.  · Recent cough or fever you have had.  RISKS AND COMPLICATIONS  Generally, tonsillectomy is a safe procedure. However, as with any procedure, problems can occur. Possible problems include:  · Bleeding.  · Infection.  · Scarring.  · Changes in your sense of taste.  · Changes in your voice.  · Changes in swallowing.  BEFORE THE PROCEDURE  · Do not take any aspirin, ibuprofen, or any medicines that may contain these agents for 2 weeks before the procedure.  · Do not eat or drink after midnight the night before the procedure.  PROCEDURE   · You will be given a medicine that makes you go to sleep (general anesthetic).  · A device will be placed inside of your mouth that presses your tongue down so that the tonsils at the back of your throat can be removed without cuts on the outside of your neck or throat.  · Your tonsils will typically be removed with a device called an electrocautery, which will cut your tonsils out and then shrink the surrounding blood vessels at the same time so that you will not bleed after the procedure.  · Usually, stitches will not be used to close the cut. A white scab (eschar) will form in the area where your tonsils used to be.  AFTER THE PROCEDURE  After surgery, you  will be taken to a recovery area for close monitoring. Once you are awake, stable, and taking fluids well, you will be allowed to go home.   Document Released: 05/29/2000 Document Revised: 02/19/2013 Document Reviewed: 09/11/2012  ExitCare® Patient Information ©2015 [employer]. This information is not intended to replace advice given to you by your health care provider. Make sure you discuss any questions you have with your health care provider.

## 2014-06-13 LAB — CULTURE, GROUP A STREP: Strep A Culture: NEGATIVE

## 2014-08-22 ENCOUNTER — Encounter (HOSPITAL_COMMUNITY): Payer: Self-pay | Admitting: Emergency Medicine

## 2014-08-22 ENCOUNTER — Emergency Department (HOSPITAL_COMMUNITY)
Admission: EM | Admit: 2014-08-22 | Discharge: 2014-08-22 | Disposition: A | Discharge status: Home / Self Care | Payer: Medicaid Other | Attending: Emergency Medicine | Admitting: Emergency Medicine

## 2014-08-22 DIAGNOSIS — Z720 Tobacco use: Secondary | ICD-10-CM | POA: Insufficient documentation

## 2014-08-22 DIAGNOSIS — Z793 Long term (current) use of hormonal contraceptives: Secondary | ICD-10-CM | POA: Diagnosis not present

## 2014-08-22 DIAGNOSIS — I10 Essential (primary) hypertension: Secondary | ICD-10-CM | POA: Diagnosis not present

## 2014-08-22 DIAGNOSIS — K0381 Cracked tooth: Secondary | ICD-10-CM | POA: Insufficient documentation

## 2014-08-22 DIAGNOSIS — K0889 Other specified disorders of teeth and supporting structures: Secondary | ICD-10-CM

## 2014-08-22 DIAGNOSIS — K029 Dental caries, unspecified: Secondary | ICD-10-CM | POA: Diagnosis not present

## 2014-08-22 DIAGNOSIS — K088 Other specified disorders of teeth and supporting structures: Secondary | ICD-10-CM | POA: Insufficient documentation

## 2014-08-22 MED ORDER — PENICILLIN V POTASSIUM 500 MG PO TABS
500.0000 mg | ORAL_TABLET | Freq: Once | ORAL | Status: AC
  Administered 2014-08-22: 500 mg via ORAL
  Filled 2014-08-22: qty 1

## 2014-08-22 MED ORDER — IBUPROFEN 800 MG PO TABS
800.0000 mg | ORAL_TABLET | Freq: Once | ORAL | Status: AC
  Administered 2014-08-22: 800 mg via ORAL
  Filled 2014-08-22: qty 1

## 2014-08-22 MED ORDER — PENICILLIN V POTASSIUM 500 MG PO TABS: 500.0000 mg | ORAL_TABLET | Freq: Three times a day (TID) | ORAL | Status: DC

## 2014-08-22 MED ORDER — IBUPROFEN 800 MG PO TABS: 800.0000 mg | ORAL_TABLET | Freq: Three times a day (TID) | ORAL | Status: DC

## 2014-08-22 NOTE — ED Provider Notes (Signed)
CSN: [id number]     Arrival date & time 08/22/14  2020 History  This chart was scribed for non-physician practitioner, Elpidio Anis, PA-C, working with Elwin Mocha, MD by Bethel Born, ED Scribe. This patient was seen in room WTR9/WTR9 and the patient's care was started at 9:11 PM.    Chief Complaint  Patient presents with  . Dental Pain    filling fell out.     The history is provided by the patient. No language interpreter was used.   [name] is a 34 y.o. female who presents to the Emergency Department complaining of constant upper right dental pain with gradual onset today. The pt reports that a filling in the upper right portion of her mouth was displaced 3 days ago and that the same tooth fractured today. She rates the pain 8/10 in severity and OTC pain medication provided insufficient pain relief PTA. Pt has unsuccessfully attempted to reach her dentist. Tobacco+. Past Medical History  Diagnosis Date  . Hypertension    History reviewed. No pertinent past surgical history. No family history on file. History  Substance Use Topics  . Smoking status: Current Every Day Smoker  . Smokeless tobacco: Not on file  . Alcohol Use: 0.6 oz/week    1 Glasses of wine per week     Comment: occasionally    OB History    No data available     Review of Systems  Constitutional: Negative for fever.  HENT: Positive for dental problem. Negative for facial swelling and trouble swallowing.        Upper right dental pain  Gastrointestinal: Negative for nausea.      Allergies  Hydrocodone  Home Medications   Prior to Admission medications   Medication Sig Start Date End Date Taking? Authorizing Provider  levonorgestrel-ethinyl estradiol (SEASONALE,INTROVALE,JOLESSA) 0.15-0.03 MG tablet Take 1 tablet by mouth daily.   Yes Historical Provider, MD  cyclobenzaprine (FLEXERIL) 5 MG tablet Take 1 tablet (5 mg total) by mouth 3 (three) times daily as needed for muscle spasms. Patient  not taking: Reported on 06/11/2014 12/04/13   Linna Hoff, MD  diazepam (VALIUM) 5 MG tablet Take 1 tablet (5 mg total) by mouth 2 (two) times daily. Patient not taking: Reported on 06/11/2014 12/02/13   Garlon Hatchet, PA-C  guaiFENesin-codeine 100-10 MG/5ML syrup Take 5 mLs by mouth 3 (three) times daily as needed for cough. 06/11/14   Tiffany Neva Seat, PA-C  ibuprofen (ADVIL,MOTRIN) 800 MG tablet Take 1 tablet (800 mg total) by mouth 3 (three) times daily. 08/22/14   Elpidio Anis, PA-C  ketorolac (TORADOL) 10 MG tablet Take 1 tablet (10 mg total) by mouth every 6 (six) hours as needed. For pain Patient not taking: Reported on 06/11/2014 12/04/13   Linna Hoff, MD  methocarbamol (ROBAXIN) 500 MG tablet Take 1 tablet (500 mg total) by mouth 2 (two) times daily. Patient not taking: Reported on 06/11/2014 11/30/13   Fayrene Helper, PA-C  naproxen (NAPROSYN) 500 MG tablet Take 1 tablet (500 mg total) by mouth 2 (two) times daily. Patient not taking: Reported on 06/11/2014 11/30/13   Fayrene Helper, PA-C  oxyCODONE-acetaminophen (PERCOCET/ROXICET) 5-325 MG per tablet Take 1 tablet by mouth every 4 (four) hours as needed. Patient not taking: Reported on 06/11/2014 12/02/13   Garlon Hatchet, PA-C  penicillin v potassium (VEETID) 500 MG tablet Take 1 tablet (500 mg total) by mouth 3 (three) times daily. 08/22/14   Elpidio Anis, PA-C  pseudoephedrine (SUDAFED) 30  MG tablet Take 30 mg by mouth every 6 (six) hours as needed for congestion (cold symptoms).    Historical Provider, MD   BP 155/94 mmHg  Pulse 81  Temp(Src) 98.8 F (37.1 C)  Resp 18  Ht  (1.626 m)  Wt 125 lb (56.7 kg)  BMI 21.45 kg/m2  SpO2 100% on RA Physical Exam  Constitutional: She is oriented to person, place, and time. She appears well-developed and well-nourished. No distress.  HENT:  Head: Normocephalic and atraumatic.  Partially edentulous. Tooth #5 fractured with evidence of chronic decay, no visualized abscess. No submental adenopathy.   Eyes: Conjunctivae and EOM are normal.  Neck: Neck supple. No tracheal deviation present.  Cardiovascular: Normal rate.   Pulmonary/Chest: Effort normal. No respiratory distress.  Musculoskeletal: Normal range of motion.  Neurological: She is alert and oriented to person, place, and time.  Skin: Skin is warm and dry.  Psychiatric: She has a normal mood and affect. Her behavior is normal.  Nursing note and vitals reviewed.   ED Course  Procedures (including critical care time)  Coordination of Care 9:21 PM Discussed treatment plan which includes  Antibiotics and pain management with pt at bedside and pt agreed to plan.  Labs Review Labs Reviewed - No data to display  Imaging Review No results found.   EKG Interpretation None      MDM   Final diagnoses:  Pain, dental   Uncomplicated dental pain without drainable abscess.   I personally performed the services described in this documentation, which was scribed in my presence. The recorded information has been reviewed and is accurate.     Elpidio Anis, PA-C 08/22/14 2252  Elwin Mocha, MD 08/23/14 0100

## 2014-08-22 NOTE — Discharge Instructions (Signed)
Dental Pain °A tooth ache may be caused by cavities (tooth decay). Cavities expose the nerve of the tooth to air and hot or cold temperatures. It may come from an infection or abscess (also called a boil or furuncle) around your tooth. It is also often caused by dental caries (tooth decay). This causes the pain you are having. °DIAGNOSIS  °Your caregiver can diagnose this problem by exam. °TREATMENT  °· If caused by an infection, it may be treated with medications which kill germs (antibiotics) and pain medications as prescribed by your caregiver. Take medications as directed. °· Only take over-the-counter or prescription medicines for pain, discomfort, or fever as directed by your caregiver. °· Whether the tooth ache today is caused by infection or dental disease, you should see your dentist as soon as possible for further care. °SEEK MEDICAL CARE IF: °The exam and treatment you received today has been provided on an emergency basis only. This is not a substitute for complete medical or dental care. If your problem worsens or new problems (symptoms) appear, and you are unable to meet with your dentist, call or return to this location. °SEEK IMMEDIATE MEDICAL CARE IF:  °· You have a fever. °· You develop redness and swelling of your face, jaw, or neck. °· You are unable to open your mouth. °· You have severe pain uncontrolled by pain medicine. °MAKE SURE YOU:  °· Understand these instructions. °· Will watch your condition. °· Will get help right away if you are not doing well or get worse. °Document Released: 02/14/2005 Document Revised: 05/09/2011 Document Reviewed: 10/03/2007 °ExitCare® Patient Information ©2015 [employer]. This information is not intended to replace advice given to you by your health care provider. Make sure you discuss any questions you have with your health care provider. ° °Emergency Department Resource Guide °1) Find a Doctor and Pay Out of Pocket °Although you won't have to find out who  is covered by your insurance plan, it is a good idea to ask around and get recommendations. You will then need to call the office and see if the doctor you have chosen will accept you as a new patient and what types of options they offer for patients who are self-pay. Some doctors offer discounts or will set up payment plans for their patients who do not have insurance, but you will need to ask so you aren't surprised when you get to your appointment. ° °2) Contact Your Local Health Department °Not all health departments have doctors that can see patients for sick visits, but many do, so it is worth a call to see if yours does. If you don't know where your local health department is, you can check in your phone book. The CDC also has a tool to help you locate your state's health department, and many state websites also have listings of all of their local health departments. ° °3) Find a Walk-in Clinic °If your illness is not likely to be very severe or complicated, you may want to try a walk in clinic. These are popping up all over the country in pharmacies, drugstores, and shopping centers. They're usually staffed by nurse practitioners or physician assistants that have been trained to treat common illnesses and complaints. They're usually fairly quick and inexpensive. However, if you have serious medical issues or chronic medical problems, these are probably not your best option. ° °No Primary Care Doctor: °- Call [facility] at  832-8000 - they can help you locate a primary   care doctor that  accepts your insurance, provides certain services, etc. °- Physician Referral Service- 1-800-533-3463 ° °Chronic Pain Problems: °Organization         Address  Phone   Notes  °[facility]  ([phone]) [phone] Patients need to be referred by their primary care doctor.  ° °Medication Assistance: °Organization         Address  Phone   Notes  °[place] [facility] [address]  [address] °[place], [place] [address] [phone]) [phone] --Must be a resident of [place] °-- Must have NO insurance coverage whatsoever (no Medicaid/ Medicare, etc.) °-- The pt. MUST have a primary care doctor that directs their care regularly and follows them in the community °  °[facility]  (866) 331-1348   °[employer]  (888) 892-1162   ° °Agencies that provide inexpensive medical care: °Organization         Address  Phone   Notes  °[facility]  ([phone]) [phone]   °[facility]    (336) 832-7272   °[facility] [address] °[place], [place] [address] ([phone]) 832-4777   °[facility] [address], °[place] (336) 271-4999   °[facility]    (336) 373-0678   °[facility]    (336) 272-1050   °[facility] ° [address], [place] Phone:  (336) 832-4444, Fax:  ([phone]) [phone] Hours of Operation:  9 am - 6 pm, M-F.  Also accepts Medicaid/Medicare and self-pay.  °[facility] ° [address], [place] Phone: ([phone]) [phone], Fax: ([phone]) [phone]. Hours of Operation:  8:30 am - 5:30 pm, M-F.  Also accepts Medicaid and self-pay.  °[facility] [place] [address], [place] Phone: ([phone]) [phone]   °[facility] [address], [place], [place] (336)[phone], Ext. 123 Mondays & Thursdays: 7-9 AM.  First 15 patients are seen on a first come, first serve basis. °  ° °Medicaid-accepting [place] Providers: ° °Organization         Address  Phone   Notes  °[facility] [address], [place] ([phone]) [phone] Also accepts self-pay patients.  °[facility] [address], [place] ° (336) [phone]   °[facility] [address], [place] ([phone]) [phone]   °[facility] [address] [place] [address], [place] (336) [phone]   °[facility] [address], [place]  ° ([phone]) [phone] Only accepts  Access Medicaid patients after they have their name applied to their card.  ° °Self-Pay (no insurance) in [place]: ° °Organization         Address  Phone   Notes  °Sickle Cell Patients, [facility] [address], [place] ([phone]) [phone]   °[facility] [address], [place] ([phone]) [phone]   °[facility] [place] [facility] ° [address], [place] (336) [phone]   °[facility]/Dr. Osei-Bonsu ° [address] [place] [address], [place] or [address], [place] [phone]) [phone] Phone number for both [place] and [place] locations is the same.  °[facility] [address], [place] ([phone]) [phone]   °[facility]  [address] [place] [address],  or [address] ([phone]) [phone] °(336) [phone]   °  [facility] [address], [place] (336) 350-1642, phone; ([phone]) [phone], fax Sees patients 1st and 3rd Saturday of every month.  Must not qualify for public or private insurance (i.e. Medicaid, Medicare, [place] Health Choice, Veterans' Benefits) • Household income should be no more than 200% of the poverty level •The clinic cannot treat you if you are pregnant or think you are pregnant • Sexually transmitted diseases are not treated at the clinic.  ° ° °Dental Care: °Organization         Address  Phone  Notes  °[facility] [address], [place] (336) [phone] Accepts children up to age 21 who are enrolled in Medicaid or [place] Health Choice; pregnant women with a Medicaid card; and children who have applied for Medicaid or [place] Health Choice, but were declined, whose parents can pay a reduced fee at time of service.  °[facility] [place]  [address], [place] (336) 641-7733 Accepts children up to age 21 who are  enrolled in Medicaid or [place] Health Choice; pregnant women with a Medicaid card; and children who have applied for Medicaid or [place] Health Choice, but were declined, whose parents can pay a reduced fee at time of service.  °[facility] ° [address], [place] ([phone]) [phone] Patients are seen by appointment only. Walk-ins are not accepted. [facility] will see patients 18 years of age and older. °Monday - Tuesday (8am-5pm) °Most Wednesdays (8:30-5pm) °$30 per visit, cash only  °[facility] ° [address], [place] ([phone]) [phone] Patients are seen by appointment only. Walk-ins are not accepted. [facility] will see patients 18 years of age and older. °One Wednesday Evening (Monthly: Volunteer Based).  $30 per visit, cash only  °[facility]  ([phone]) [phone] for adults; Children under age 4, call Graduate Pediatric Dentistry at ([phone]) [phone]. Children aged 4-14, please call ([phone]) [phone] to request a pediatric application. ° Dental services are provided in all areas of dental care including fillings, crowns and bridges, complete and partial dentures, implants, gum treatment, root canals, and extractions. Preventive care is also provided. Treatment is provided to both adults and children. °Patients are selected via a lottery and there is often a waiting list. °  °[facility] [address], °[place] ° (336) 763-8833 [email or web address] °  °[facility] [address], [place], [place] (336)723-1848, Ext. 123 Second and Fourth Thursday of each month, opens at 6:30 AM; Clinic ends at 9 AM.  Patients are seen on a first-come first-served basis, and a limited number are seen during each clinic.  ° °[facility] ° [address], [place], [place] ([phone]) [phone]   Eligibility Requirements °You must have lived in [place], [place], or [place] counties for at least the last three months. °  You  cannot be eligible for state or federal sponsored healthcare insurance, including Veterans Administration, Medicaid, or Medicare. °  You generally cannot be eligible for healthcare insurance through your employer.  °  How to apply: °Eligibility screenings are held every Tuesday and Wednesday afternoon from 1:00 pm until 4:00 pm. You do not need an appointment for the interview!  °[facility] [address], [place], [place] [phone]   °[facility]  [phone]   °[facility]  [phone]   °[facility]  [phone]   °  °

## 2014-08-22 NOTE — ED Notes (Signed)
Pt from home states her filling fell out of her tooth and then the same tooth cracked 3 days ago. She has been trying to get an appointment with her dentist, but she states they have not returned her phone calls to make an appointment. The injury is on the right upper jaw.

## 2014-10-12 ENCOUNTER — Emergency Department (HOSPITAL_COMMUNITY)
Admission: EM | Admit: 2014-10-12 | Discharge: 2014-10-12 | Disposition: A | Discharge status: Home / Self Care | Payer: Medicaid Other | Attending: Emergency Medicine | Admitting: Emergency Medicine

## 2014-10-12 ENCOUNTER — Encounter (HOSPITAL_COMMUNITY): Payer: Self-pay | Admitting: Emergency Medicine

## 2014-10-12 DIAGNOSIS — Z720 Tobacco use: Secondary | ICD-10-CM | POA: Insufficient documentation

## 2014-10-12 DIAGNOSIS — R197 Diarrhea, unspecified: Secondary | ICD-10-CM

## 2014-10-12 DIAGNOSIS — N390 Urinary tract infection, site not specified: Secondary | ICD-10-CM

## 2014-10-12 DIAGNOSIS — Z79899 Other long term (current) drug therapy: Secondary | ICD-10-CM | POA: Insufficient documentation

## 2014-10-12 DIAGNOSIS — Z3202 Encounter for pregnancy test, result negative: Secondary | ICD-10-CM | POA: Insufficient documentation

## 2014-10-12 DIAGNOSIS — I10 Essential (primary) hypertension: Secondary | ICD-10-CM | POA: Diagnosis not present

## 2014-10-12 LAB — URINALYSIS, ROUTINE W REFLEX MICROSCOPIC
Bilirubin Urine: NEGATIVE
Glucose, UA: NEGATIVE mg/dL
KETONES UR: 15 mg/dL — AB
NITRITE: POSITIVE — AB
PH: 6.5 (ref 5.0–8.0)
Protein, ur: NEGATIVE mg/dL
Specific Gravity, Urine: 1.027 (ref 1.005–1.030)
Urobilinogen, UA: 1 mg/dL (ref 0.0–1.0)

## 2014-10-12 LAB — COMPREHENSIVE METABOLIC PANEL
ALBUMIN: 4.1 g/dL (ref 3.5–5.0)
ALT: 14 U/L (ref 14–54)
AST: 19 U/L (ref 15–41)
Alkaline Phosphatase: 52 U/L (ref 38–126)
Anion gap: 7 (ref 5–15)
BUN: 8 mg/dL (ref 6–20)
CO2: 25 mmol/L (ref 22–32)
CREATININE: 0.62 mg/dL (ref 0.44–1.00)
Calcium: 9.2 mg/dL (ref 8.9–10.3)
Chloride: 105 mmol/L (ref 101–111)
GLUCOSE: 95 mg/dL (ref 65–99)
Potassium: 3.8 mmol/L (ref 3.5–5.1)
Sodium: 137 mmol/L (ref 135–145)
Total Bilirubin: 0.5 mg/dL (ref 0.3–1.2)
Total Protein: 8.1 g/dL (ref 6.5–8.1)

## 2014-10-12 LAB — CBC
HCT: 40.3 % (ref 36.0–46.0)
Hemoglobin: 13.7 g/dL (ref 12.0–15.0)
MCH: 30.4 pg (ref 26.0–34.0)
MCHC: 34 g/dL (ref 30.0–36.0)
MCV: 89.4 fL (ref 78.0–100.0)
PLATELETS: 273 10*3/uL (ref 150–400)
RBC: 4.51 MIL/uL (ref 3.87–5.11)
RDW: 12.5 % (ref 11.5–15.5)
WBC: 7.6 10*3/uL (ref 4.0–10.5)

## 2014-10-12 LAB — URINE MICROSCOPIC-ADD ON

## 2014-10-12 LAB — POC URINE PREG, ED: PREG TEST UR: NEGATIVE

## 2014-10-12 LAB — LIPASE, BLOOD: LIPASE: 20 U/L — AB (ref 22–51)

## 2014-10-12 MED ORDER — CEPHALEXIN 500 MG PO CAPS: 500.0000 mg | ORAL_CAPSULE | Freq: Four times a day (QID) | ORAL | Status: DC

## 2014-10-12 MED ORDER — ONDANSETRON 8 MG PO TBDP: ORAL_TABLET | ORAL | Status: DC

## 2014-10-12 MED ORDER — ONDANSETRON 8 MG PO TBDP
8.0000 mg | ORAL_TABLET | Freq: Once | ORAL | Status: AC
  Administered 2014-10-12: 8 mg via ORAL
  Filled 2014-10-12: qty 1

## 2014-10-12 NOTE — Discharge Instructions (Signed)
1. Medications: Keflex, Zofran, usual home medications 2. Treatment: rest, drink plenty of fluids, take medications as prescribed 3. Follow Up: Please followup with your primary doctor in 3 days for discussion of your diagnoses and further evaluation after today's visit; if you do not have a primary care doctor use the resource guide provided to find one; return to the ER for fevers, persistent vomiting, worsening abdominal pain or other concerning symptoms.   Diarrhea Diarrhea is frequent loose and watery bowel movements. It can cause you to feel weak and dehydrated. Dehydration can cause you to become tired and thirsty, have a dry mouth, and have decreased urination that often is dark yellow. Diarrhea is a sign of another problem, most often an infection that will not last long. In most cases, diarrhea typically lasts 2-3 days. However, it can last longer if it is a sign of something more serious. It is important to treat your diarrhea as directed by your caregiver to lessen or prevent future episodes of diarrhea. CAUSES  Some common causes include:  Gastrointestinal infections caused by viruses, bacteria, or parasites.  Food poisoning or food allergies.  Certain medicines, such as antibiotics, chemotherapy, and laxatives.  Artificial sweeteners and fructose.  Digestive disorders. HOME CARE INSTRUCTIONS  Ensure adequate fluid intake (hydration): Have 1 cup (8 oz) of fluid for each diarrhea episode. Avoid fluids that contain simple sugars or sports drinks, fruit juices, whole milk products, and sodas. Your urine should be clear or pale yellow if you are drinking enough fluids. Hydrate with an oral rehydration solution that you can purchase at pharmacies, retail stores, and online. You can prepare an oral rehydration solution at home by mixing the following ingredients together:   - tsp table salt.   tsp baking soda.   tsp salt substitute containing potassium chloride.  1  tablespoons  sugar.  1 L (34 oz) of water.  Certain foods and beverages may increase the speed at which food moves through the gastrointestinal (GI) tract. These foods and beverages should be avoided and include:  Caffeinated and alcoholic beverages.  High-fiber foods, such as raw fruits and vegetables, nuts, seeds, and whole grain breads and cereals.  Foods and beverages sweetened with sugar alcohols, such as xylitol, sorbitol, and mannitol.  Some foods may be well tolerated and may help thicken stool including:  Starchy foods, such as rice, toast, pasta, low-sugar cereal, oatmeal, grits, baked potatoes, crackers, and bagels.  Bananas.  Applesauce.  Add probiotic-rich foods to help increase healthy bacteria in the GI tract, such as yogurt and fermented milk products.  Wash your hands well after each diarrhea episode.  Only take over-the-counter or prescription medicines as directed by your caregiver.  Take a warm bath to relieve any burning or pain from frequent diarrhea episodes. SEEK IMMEDIATE MEDICAL CARE IF:   You are unable to keep fluids down.  You have persistent vomiting.  You have blood in your stool, or your stools are black and tarry.  You do not urinate in 6-8 hours, or there is only a small amount of very dark urine.  You have abdominal pain that increases or localizes.  You have weakness, dizziness, confusion, or light-headedness.  You have a severe headache.  Your diarrhea gets worse or does not get better.  You have a fever or persistent symptoms for more than 2-3 days.  You have a fever and your symptoms suddenly get worse. MAKE SURE YOU:   Understand these instructions.  Will watch your condition.  Will get help right away if you are not doing well or get worse. Document Released: 02/04/2002 Document Revised: 07/01/2013 Document Reviewed: 10/23/2011 [facility] Patient Information 2015 [place], [place]. This information is not intended to replace advice given  to you by your health care provider. Make sure you discuss any questions you have with your health care provider.   Urinary Tract Infection Urinary tract infections (UTIs) can develop anywhere along your urinary tract. Your urinary tract is your body's drainage system for removing wastes and extra water. Your urinary tract includes two kidneys, two ureters, a bladder, and a urethra. Your kidneys are a pair of bean-shaped organs. Each kidney is about the size of your fist. They are located below your ribs, one on each side of your spine. CAUSES Infections are caused by microbes, which are microscopic organisms, including fungi, viruses, and bacteria. These organisms are so small that they can only be seen through a microscope. Bacteria are the microbes that most commonly cause UTIs. SYMPTOMS  Symptoms of UTIs may vary by age and gender of the patient and by the location of the infection. Symptoms in young women typically include a frequent and intense urge to urinate and a painful, burning feeling in the bladder or urethra during urination. Older women and men are more likely to be tired, shaky, and weak and have muscle aches and abdominal pain. A fever may mean the infection is in your kidneys. Other symptoms of a kidney infection include pain in your back or sides below the ribs, nausea, and vomiting. DIAGNOSIS To diagnose a UTI, your caregiver will ask you about your symptoms. Your caregiver also will ask to provide a urine sample. The urine sample will be tested for bacteria and white blood cells. White blood cells are made by your body to help fight infection. TREATMENT  Typically, UTIs can be treated with medication. Because most UTIs are caused by a bacterial infection, they usually can be treated with the use of antibiotics. The choice of antibiotic and length of treatment depend on your symptoms and the type of bacteria causing your infection. HOME CARE INSTRUCTIONS  If you were prescribed  antibiotics, take them exactly as your caregiver instructs you. Finish the medication even if you feel better after you have only taken some of the medication.  Drink enough water and fluids to keep your urine clear or pale yellow.  Avoid caffeine, tea, and carbonated beverages. They tend to irritate your bladder.  Empty your bladder often. Avoid holding urine for long periods of time.  Empty your bladder before and after sexual intercourse.  After a bowel movement, women should cleanse from front to back. Use each tissue only once. SEEK MEDICAL CARE IF:   You have back pain.  You develop a fever.  Your symptoms do not begin to resolve within 3 days. SEEK IMMEDIATE MEDICAL CARE IF:   You have severe back pain or lower abdominal pain.  You develop chills.  You have nausea or vomiting.  You have continued burning or discomfort with urination. MAKE SURE YOU:   Understand these instructions.  Will watch your condition.  Will get help right away if you are not doing well or get worse. Document Released: 11/24/2004 Document Revised: 08/16/2011 Document Reviewed: 03/25/2011 [facility] Patient Information 2015 [place], [place]. This information is not intended to replace advice given to you by your health care provider. Make sure you discuss any questions you have with your health care provider.

## 2014-10-12 NOTE — ED Provider Notes (Signed)
CSN: [id number]     Arrival date & time 10/12/14  1122 History   First MD Initiated Contact with Patient 10/12/14 1506     Chief Complaint  Patient presents with  . Diarrhea     (Consider location/radiation/quality/duration/timing/severity/associated sxs/prior Treatment) The history is provided by the patient and medical records. No language interpreter was used.     [name] is a 34 y.o. female  with a hx of HTN presents to the Emergency Department complaining of gradual, persistent, progressively worsening generalized abd pain onset 2 days ago. Pt reports pain is a mild, generalized cramping rated at a 3/10 without radiation. Associated symptoms include diarrhea x 24 hours beginning Friday morning but none since Saturday afternoon (24 hours since the last episode).  Pt reports the diarrhea was "runny and green" but did look closely.  Denies melena or hematochezia.  Pt with nausea without vomiting. Dramamine makes the nausea better and nothing makes it worse though pt has limited her PO food intake since the symptoms began.  Pt denies fever, chills, headache, neck pain, chest pain, SOB, vomiting, weakness, dizziness, syncope.  Does endorses urinary frequency and urgency.  LMP: 2 mos ago; Pt reports she is taking seasonique and has a menses q 3 mos.  Pt denies sick contacts, but has children at home who attend daycare.  Pt denies hx of abd surgery.     Past Medical History  Diagnosis Date  . Hypertension    History reviewed. No pertinent past surgical history. History reviewed. No pertinent family history. Social History  Substance Use Topics  . Smoking status: Current Every Day Smoker  . Smokeless tobacco: None  . Alcohol Use: 0.6 oz/week    1 Glasses of wine per week     Comment: occasionally    OB History    No data available     Review of Systems  Constitutional: Negative for fever, diaphoresis, appetite change, fatigue and unexpected weight change.  HENT: Negative for  mouth sores.   Eyes: Negative for visual disturbance.  Respiratory: Negative for cough, chest tightness, shortness of breath and wheezing.   Cardiovascular: Negative for chest pain.  Gastrointestinal: Positive for nausea, abdominal pain (cramping) and diarrhea (resolved). Negative for vomiting and constipation.  Endocrine: Negative for polydipsia, polyphagia and polyuria.  Genitourinary: Negative for dysuria, urgency, frequency and hematuria.  Musculoskeletal: Negative for back pain and neck stiffness.  Skin: Negative for rash.  Allergic/Immunologic: Negative for immunocompromised state.  Neurological: Negative for syncope, light-headedness and headaches.  Hematological: Does not bruise/bleed easily.  Psychiatric/Behavioral: Negative for sleep disturbance. The patient is not nervous/anxious.       Allergies  Hydrocodone  Home Medications   Prior to Admission medications   Medication Sig Start Date End Date Taking? Authorizing Provider  dimenhyDRINATE (DRAMAMINE) 50 MG tablet Take 50 mg by mouth every 8 (eight) hours as needed for nausea.   Yes Historical Provider, MD  levonorgestrel-ethinyl estradiol (SEASONALE,INTROVALE,JOLESSA) 0.15-0.03 MG tablet Take 1 tablet by mouth daily.   Yes Historical Provider, MD  pseudoephedrine (SUDAFED) 30 MG tablet Take 30 mg by mouth every 6 (six) hours as needed for congestion (cold symptoms).   Yes Historical Provider, MD  cephALEXin (KEFLEX) 500 MG capsule Take 1 capsule (500 mg total) by mouth 4 (four) times daily. 10/12/14   Jenniffer Vessels, PA-C  guaiFENesin-codeine 100-10 MG/5ML syrup Take 5 mLs by mouth 3 (three) times daily as needed for cough. Patient not taking: Reported on 10/12/2014 06/11/14   Tiffany  Neva Seat, PA-C  ibuprofen (ADVIL,MOTRIN) 800 MG tablet Take 1 tablet (800 mg total) by mouth 3 (three) times daily. Patient not taking: Reported on 10/12/2014 08/22/14   Elpidio Anis, PA-C  ondansetron (ZOFRAN ODT) 8 MG disintegrating tablet  8mg  ODT q4 hours prn nausea 10/12/14   Dahlia Client Belanna Manring, PA-C  penicillin v potassium (VEETID) 500 MG tablet Take 1 tablet (500 mg total) by mouth 3 (three) times daily. Patient not taking: Reported on 10/12/2014 08/22/14   Elpidio Anis, PA-C   BP 143/96 mmHg  Pulse 76  Temp(Src) 98.1 F (36.7 C) (Oral)  Resp 18  Wt 127 lb (57.607 kg)  SpO2 100% Physical Exam  Constitutional: She appears well-developed and well-nourished. No distress.  Awake, alert, nontoxic appearance  HENT:  Head: Normocephalic and atraumatic.  Mouth/Throat: Oropharynx is clear and moist. No oropharyngeal exudate.  Eyes: Conjunctivae are normal. No scleral icterus.  Neck: Normal range of motion. Neck supple.  Cardiovascular: Normal rate, regular rhythm, normal heart sounds and intact distal pulses.   Pulmonary/Chest: Effort normal and breath sounds normal. No respiratory distress. She has no wheezes.  Equal chest expansion  Abdominal: Soft. Bowel sounds are normal. She exhibits no distension and no mass. There is no tenderness. There is no rebound, no guarding and no CVA tenderness.  Soft and nontender  Musculoskeletal: Normal range of motion. She exhibits no edema.  Neurological: She is alert.  Speech is clear and goal oriented Moves extremities without ataxia  Skin: Skin is warm and dry. No rash noted. She is not diaphoretic. No erythema.  Psychiatric: She has a normal mood and affect.  Nursing note and vitals reviewed.   ED Course  Procedures (including critical care time) Labs Review Labs Reviewed  LIPASE, BLOOD - Abnormal; Notable for the following:    Lipase 20 (*)    All other components within normal limits  URINALYSIS, ROUTINE W REFLEX MICROSCOPIC (NOT AT [facility]) - Abnormal; Notable for the following:    Color, Urine AMBER (*)    APPearance CLOUDY (*)    Hgb urine dipstick TRACE (*)    Ketones, ur 15 (*)    Nitrite POSITIVE (*)    Leukocytes, UA SMALL (*)    All other components within normal  limits  URINE MICROSCOPIC-ADD ON - Abnormal; Notable for the following:    Squamous Epithelial / LPF MANY (*)    Bacteria, UA MANY (*)    All other components within normal limits  OVA AND PARASITE EXAMINATION  COMPREHENSIVE METABOLIC PANEL  CBC  GI PATHOGEN PANEL BY PCR, STOOL  POC URINE PREG, ED    Imaging Review No results found. I, Ursula Dermody, Dahlia Client, personally reviewed and evaluated these images and lab results as part of my medical decision-making.   EKG Interpretation None      MDM   Final diagnoses:  Diarrhea  UTI (lower urinary tract infection)    Alexandrea D Wilhite presents with c/o resolved diarrhea and persistent generalized mild abd cramping with nausea.  No vomiting.  Labs reassuring without hypokalemia.  Denies BRBPR, melena or hematochezia; hgb normal.  Will obtain urine and reassess.    5:33 PM Pt feels much better and is tolerating PO.  Pt has been diagnosed with a UTI on UA and endorses urinary urgency and frequency. Pt is afebrile, no CVA tenderness, normotensive, and denies N/V. Pt to be dc home with antibiotics and instructions to follow up with PCP if symptoms persist.  BP 143/96 mmHg  Pulse 76  Temp(Src)  98.1 F (36.7 C) (Oral)  Resp 18  Wt 127 lb (57.607 kg)  SpO2 100%     Dierdre Forth, PA-C 10/12/14 1733  Marily Memos, MD 10/14/14 [phone]

## 2014-10-12 NOTE — ED Notes (Signed)
Pt reports diarrhea since Saturday am along with generalized abd cramping. Pt denies n/v but has been taking dramamine.

## 2014-10-12 NOTE — ED Notes (Signed)
Awake. Verbally responsive. A/O x4. Resp even and unlabored. No audible adventitious breath sounds noted. ABC's intact.  

## 2014-10-12 NOTE — ED Notes (Signed)
Pt reported having diarrhea on Friday-Saturday but denies diarrhea in past 24 hrs, nausea without emesis. Pt has taken recently completed ABT within past month. Pt reported abd cramping. Pt was given ice chips without n/v/d reported. Abd soft/nondistended/nontender. BS (+) and active x4 quadrants. LBM was Friday.

## 2014-11-20 ENCOUNTER — Encounter: Payer: Self-pay | Admitting: *Deleted

## 2014-12-09 ENCOUNTER — Encounter: Payer: Self-pay | Admitting: *Deleted

## 2014-12-09 ENCOUNTER — Encounter: Payer: Self-pay | Admitting: Obstetrics & Gynecology

## 2014-12-09 ENCOUNTER — Ambulatory Visit (INDEPENDENT_AMBULATORY_CARE_PROVIDER_SITE_OTHER): Payer: Medicaid Other | Admitting: Obstetrics & Gynecology

## 2014-12-09 VITALS — BP 150/96 | HR 78 | Resp 18 | Ht 64.0 in | Wt 128.0 lb

## 2014-12-09 DIAGNOSIS — Z3009 Encounter for other general counseling and advice on contraception: Secondary | ICD-10-CM

## 2014-12-09 NOTE — Progress Notes (Signed)
CLINIC ENCOUNTER NOTE  History:  34 y.o. [id number] here today for sterilization consult, referred from [facility]. Is on OCPs but is a heavy smoke; has used Depo Provera in past but did not like it.  Declines any LARCs, partner refuses to get a vasectomy. She denies any abnormal vaginal discharge, bleeding, pelvic pain or other concerns.   Past Medical History  Diagnosis Date  . Hypertension     Past Surgical History  Procedure Laterality Date  . Cesarean section      The following portions of the patient's history were reviewed and updated as appropriate: allergies, current medications, past family history, past medical history, past social history, past surgical history and problem list.   Health Maintenance:  Normal pap this year at [facility].   Review of Systems:  Pertinent items noted in HPI and remainder of comprehensive ROS otherwise negative.  Objective:  Physical Exam BP 150/96 mmHg  Pulse 78  Resp 18  Ht  (1.626 m)  Wt 128 lb (58.06 kg)  BMI 21.96 kg/m2  LMP 11/17/2014 (Approximate) CONSTITUTIONAL: Well-developed, well-nourished female in no acute distress.  HENT:  Normocephalic, atraumatic. External right and left ear normal. Oropharynx is clear and moist EYES: Conjunctivae and EOM are normal. Pupils are equal, round, and reactive to light. No scleral icterus.  NECK: Normal range of motion, supple, no masses SKIN: Skin is warm and dry. No rash noted. Not diaphoretic. No erythema. No pallor. NEUROLGIC: Alert and oriented to person, place, and time. Normal reflexes, muscle tone coordination. No cranial nerve deficit noted. PSYCHIATRIC: Normal mood and affect. Normal behavior. Normal judgment and thought content. CARDIOVASCULAR: Normal heart rate noted RESPIRATORY: Effort and breath sounds normal, no problems with respiration noted ABDOMEN: Soft, no distention noted, inverted umbilicus.   PELVIC: Deferred MUSCULOSKELETAL: Normal range of motion. No edema  noted.   Assessment & Plan:  Consultation for female sterilization Patient desires bilateral tubal sterilization.  Other reversible forms of contraception were discussed with patient; she declines all other modalities. Discussed bilateral tubal sterilization in detail; discussed options of laparoscopic bilateral tubal sterilization using Filshie clips vs laparoscopic bilateral salpingectomy. Risks and benefits discussed in detail including but not limited to: risk of regret, permanence of method, bleeding, infection, injury to surrounding organs and need for additional procedures.  Failure risk of 1-2 % for Filshie clips and <1% for bilateral salpingectomy with increased risk of ectopic gestation if pregnancy occurs was also discussed with patient.  Emphasized that removal of fallopian tubes do not result in any known hormonal imbalance.  Patient verbalized understanding of these risks and benefits and wants to proceed with sterilization with laparoscopic bilateral sterilization using Filshie clips.  She was told that she will be contacted by our surgical scheduler regarding the time and date of her surgery; routine preoperative instructions of having nothing to eat or drink after midnight on the day prior to surgery and also coming to the hospital 1 1/2 hours prior to her time of surgery were also emphasized.  She was told she may be called for a preoperative appointment about a week prior to surgery and will be given further preoperative instructions at that visit.  Routine postoperative instructions will be reviewed with the patient and her family in detail after surgery. Printed patient education handouts about the procedure was given to the patient to review at home.  Medicaid papers signed today, patient understands that surgery will be scheduled at least 30 days after the day papers are signed as  per Medicaid guidelines.  In the meantime, patient will use OCPs for contraception prior to  surgery.  Total face-to-face time with patient: 20 minutes. Over 50% of encounter was spent on counseling and coordination of care.   Jaynie Collins, MD, FACOG Attending Obstetrician & Gynecologist, [facility]

## 2014-12-10 ENCOUNTER — Encounter (HOSPITAL_COMMUNITY): Payer: Self-pay | Admitting: *Deleted

## 2014-12-31 ENCOUNTER — Emergency Department (HOSPITAL_COMMUNITY): Payer: No Typology Code available for payment source

## 2014-12-31 ENCOUNTER — Encounter: Payer: Self-pay | Admitting: Obstetrics & Gynecology

## 2014-12-31 ENCOUNTER — Encounter (HOSPITAL_COMMUNITY): Payer: Self-pay | Admitting: Emergency Medicine

## 2014-12-31 ENCOUNTER — Emergency Department (HOSPITAL_COMMUNITY)
Admission: EM | Admit: 2014-12-31 | Discharge: 2014-12-31 | Disposition: A | Discharge status: Home / Self Care | Payer: No Typology Code available for payment source | Attending: Emergency Medicine | Admitting: Emergency Medicine

## 2014-12-31 DIAGNOSIS — Z720 Tobacco use: Secondary | ICD-10-CM | POA: Insufficient documentation

## 2014-12-31 DIAGNOSIS — S3992XA Unspecified injury of lower back, initial encounter: Secondary | ICD-10-CM | POA: Insufficient documentation

## 2014-12-31 DIAGNOSIS — Z79899 Other long term (current) drug therapy: Secondary | ICD-10-CM | POA: Diagnosis not present

## 2014-12-31 DIAGNOSIS — Y9389 Activity, other specified: Secondary | ICD-10-CM | POA: Insufficient documentation

## 2014-12-31 DIAGNOSIS — Y998 Other external cause status: Secondary | ICD-10-CM | POA: Insufficient documentation

## 2014-12-31 DIAGNOSIS — S199XXA Unspecified injury of neck, initial encounter: Secondary | ICD-10-CM | POA: Insufficient documentation

## 2014-12-31 DIAGNOSIS — S51002A Unspecified open wound of left elbow, initial encounter: Secondary | ICD-10-CM

## 2014-12-31 DIAGNOSIS — S51012A Laceration without foreign body of left elbow, initial encounter: Secondary | ICD-10-CM | POA: Diagnosis not present

## 2014-12-31 DIAGNOSIS — S79912A Unspecified injury of left hip, initial encounter: Secondary | ICD-10-CM | POA: Diagnosis not present

## 2014-12-31 DIAGNOSIS — R000 Tachycardia, unspecified: Secondary | ICD-10-CM | POA: Diagnosis not present

## 2014-12-31 DIAGNOSIS — Y92410 Unspecified street and highway as the place of occurrence of the external cause: Secondary | ICD-10-CM | POA: Insufficient documentation

## 2014-12-31 LAB — COMPREHENSIVE METABOLIC PANEL
ALK PHOS: 52 U/L (ref 38–126)
ALT: 20 U/L (ref 14–54)
AST: 30 U/L (ref 15–41)
Albumin: 3.9 g/dL (ref 3.5–5.0)
Anion gap: 8 (ref 5–15)
CHLORIDE: 103 mmol/L (ref 101–111)
CO2: 22 mmol/L (ref 22–32)
CREATININE: 0.66 mg/dL (ref 0.44–1.00)
Calcium: 9.3 mg/dL (ref 8.9–10.3)
GFR calc non Af Amer: >60 mL/min (ref >60)
GLUCOSE: 92 mg/dL (ref 65–99)
Potassium: 3.5 mmol/L (ref 3.5–5.1)
SODIUM: 133 mmol/L — AB (ref 135–145)
Total Bilirubin: 0.4 mg/dL (ref 0.3–1.2)
Total Protein: 6.9 g/dL (ref 6.5–8.1)

## 2014-12-31 LAB — CBC
HCT: 41.2 % (ref 36.0–46.0)
Hemoglobin: 14 g/dL (ref 12.0–15.0)
MCH: 30.4 pg (ref 26.0–34.0)
MCHC: 34 g/dL (ref 30.0–36.0)
MCV: 89.6 fL (ref 78.0–100.0)
PLATELETS: 236 10*3/uL (ref 150–400)
RBC: 4.6 MIL/uL (ref 3.87–5.11)
RDW: 12.7 % (ref 11.5–15.5)
WBC: 6.9 10*3/uL (ref 4.0–10.5)

## 2014-12-31 LAB — ETHANOL: ALCOHOL ETHYL (B): 114 mg/dL — AB (ref <5)

## 2014-12-31 LAB — SAMPLE TO BLOOD BANK

## 2014-12-31 LAB — RAPID URINE DRUG SCREEN, HOSP PERFORMED
AMPHETAMINES: NOT DETECTED
BARBITURATES: NOT DETECTED
Benzodiazepines: NOT DETECTED
Cocaine: POSITIVE — AB
OPIATES: POSITIVE — AB
TETRAHYDROCANNABINOL: POSITIVE — AB

## 2014-12-31 LAB — CDS SEROLOGY

## 2014-12-31 LAB — PREGNANCY, URINE: Preg Test, Ur: NEGATIVE

## 2014-12-31 MED ORDER — LIDOCAINE-EPINEPHRINE-TETRACAINE (LET) SOLUTION: 3.0000 mL | Freq: Once | NASAL | Status: DC

## 2014-12-31 MED ORDER — IOHEXOL 300 MG/ML  SOLN
100.0000 mL | Freq: Once | INTRAMUSCULAR | Status: AC | PRN
  Administered 2014-12-31: 100 mL via INTRAVENOUS

## 2014-12-31 MED ORDER — OXYCODONE-ACETAMINOPHEN 5-325 MG PO TABS
1.0000 | ORAL_TABLET | Freq: Once | ORAL | Status: AC
  Administered 2014-12-31: 1 via ORAL
  Filled 2014-12-31: qty 1

## 2014-12-31 MED ORDER — SODIUM CHLORIDE 0.9 % IV BOLUS (SEPSIS)
1000.0000 mL | Freq: Once | INTRAVENOUS | Status: AC
  Administered 2014-12-31: 1000 mL via INTRAVENOUS

## 2014-12-31 MED ORDER — TETANUS-DIPHTH-ACELL PERTUSSIS 5-2.5-18.5 LF-MCG/0.5 IM SUSP: 0.5000 mL | Freq: Once | INTRAMUSCULAR | Status: DC

## 2014-12-31 NOTE — ED Notes (Signed)
Pt taken to xray via wheelchair, pt continues to refuse to undress, pt is requesting food and pian medication
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
## 2014-12-31 NOTE — ED Notes (Signed)
Pt will not allow staff to leave monitor on. Respirations even and unlabored. Pt tearful

## 2014-12-31 NOTE — ED Notes (Signed)
Pt is refusing to be placed on monitor, refusing to get in gown, ccollar. Refusing to sit in stretcher.

## 2014-12-31 NOTE — ED Notes (Signed)
Returned from radiology. 
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
## 2015-01-14 NOTE — Patient Instructions (Addendum)
Your procedure is scheduled on:  Monday, Nov. 28, 2016  [facility] at:  8:15 am  Pick up the phone at the desk and dial [phone].  Call this number if you have problems the morning of surgery: [phone].  Remember: Do NOT eat food or drink after:  Midnight Sunday Nov. 27, 2016 Take these medicines the morning of surgery with a SIP OF WATER:  None   Do NOT wear jewelry (body piercing), metal hair clips/bobby pins, make-up, or nail polish. Do NOT wear lotions, powders, or perfumes.  You may wear deoderant. Do NOT shave for 48 hours prior to surgery. Do NOT bring valuables to the hospital. Contacts, dentures, or bridgework may not be worn into surgery. Have a responsible adult drive you home and stay with you for 24 hours after your procedure

## 2015-01-15 ENCOUNTER — Encounter (HOSPITAL_COMMUNITY)
Admission: RE | Admit: 2015-01-15 | Discharge: 2015-01-15 | Disposition: A | Discharge status: Home / Self Care | Payer: Medicaid Other | Source: Ambulatory Visit | Attending: Obstetrics & Gynecology | Admitting: Obstetrics & Gynecology

## 2015-01-15 ENCOUNTER — Encounter (HOSPITAL_COMMUNITY): Payer: Self-pay

## 2015-01-15 DIAGNOSIS — Z01812 Encounter for preprocedural laboratory examination: Secondary | ICD-10-CM | POA: Insufficient documentation

## 2015-01-15 LAB — CBC
HCT: 38.5 % (ref 36.0–46.0)
HEMOGLOBIN: 13.3 g/dL (ref 12.0–15.0)
MCH: 31 pg (ref 26.0–34.0)
MCHC: 34.5 g/dL (ref 30.0–36.0)
MCV: 89.7 fL (ref 78.0–100.0)
PLATELETS: 238 10*3/uL (ref 150–400)
RBC: 4.29 MIL/uL (ref 3.87–5.11)
RDW: 12.8 % (ref 11.5–15.5)
WBC: 8.3 10*3/uL (ref 4.0–10.5)

## 2015-01-25 ENCOUNTER — Encounter (HOSPITAL_COMMUNITY): Payer: Self-pay | Admitting: Anesthesiology

## 2015-01-25 NOTE — Anesthesia Preprocedure Evaluation (Addendum)
Anesthesia Evaluation  Patient identified by MRN, date of birth, ID band Patient awake    Reviewed: Allergy & Precautions, H&P , NPO status , Patient's Chart, lab work & pertinent test results, reviewed documented beta blocker date and time   Airway Mallampati: I  TM Distance: >3 FB Neck ROM: full    Dental no notable dental hx. (+) Teeth Intact   Pulmonary Current Smoker,    Pulmonary exam normal        Cardiovascular hypertension, Normal cardiovascular exam     Neuro/Psych negative neurological ROS  negative psych ROS   GI/Hepatic negative GI ROS, Neg liver ROS,   Endo/Other  negative endocrine ROS  Renal/GU negative Renal ROS  negative genitourinary   Musculoskeletal negative musculoskeletal ROS (+)   Abdominal Normal abdominal exam  (+)   Peds  Hematology negative hematology ROS (+)   Anesthesia Other Findings   Reproductive/Obstetrics negative OB ROS Undesired fertility                            Anesthesia Physical Anesthesia Plan  ASA: II  Anesthesia Plan: General   Post-op Pain Management:    Induction: Intravenous  Airway Management Planned: Oral ETT  Additional Equipment:   Intra-op Plan:   Post-operative Plan: Extubation in OR  Informed Consent: I have reviewed the patients History and Physical, chart, labs and discussed the procedure including the risks, benefits and alternatives for the proposed anesthesia with the patient or authorized representative who has indicated his/her understanding and acceptance.   Dental advisory given  Plan Discussed with: CRNA and Surgeon  Anesthesia Plan Comments:        Anesthesia Quick Evaluation

## 2015-01-26 ENCOUNTER — Ambulatory Visit (HOSPITAL_COMMUNITY): Payer: Medicaid Other | Admitting: Anesthesiology

## 2015-01-26 ENCOUNTER — Encounter: Payer: Self-pay | Admitting: Obstetrics & Gynecology

## 2015-01-26 ENCOUNTER — Encounter (HOSPITAL_COMMUNITY): Payer: Self-pay | Admitting: Obstetrics & Gynecology

## 2015-01-26 ENCOUNTER — Ambulatory Visit (HOSPITAL_COMMUNITY)
Admission: RE | Admit: 2015-01-26 | Discharge: 2015-01-26 | Disposition: A | Discharge status: Home / Self Care | Payer: Medicaid Other | Source: Ambulatory Visit | Attending: Obstetrics & Gynecology | Admitting: Obstetrics & Gynecology

## 2015-01-26 ENCOUNTER — Encounter (HOSPITAL_COMMUNITY): Payer: Self-pay

## 2015-01-26 ENCOUNTER — Encounter (HOSPITAL_COMMUNITY)
Admission: RE | Disposition: A | Discharge status: Home / Self Care | Payer: Self-pay | Source: Ambulatory Visit | Attending: Obstetrics & Gynecology

## 2015-01-26 DIAGNOSIS — I10 Essential (primary) hypertension: Secondary | ICD-10-CM | POA: Diagnosis not present

## 2015-01-26 DIAGNOSIS — Z8249 Family history of ischemic heart disease and other diseases of the circulatory system: Secondary | ICD-10-CM | POA: Insufficient documentation

## 2015-01-26 DIAGNOSIS — Z302 Encounter for sterilization: Secondary | ICD-10-CM | POA: Diagnosis present

## 2015-01-26 DIAGNOSIS — F17200 Nicotine dependence, unspecified, uncomplicated: Secondary | ICD-10-CM | POA: Insufficient documentation

## 2015-01-26 HISTORY — PX: LAPAROSCOPIC TUBAL LIGATION: SHX1937

## 2015-01-26 LAB — PREGNANCY, URINE: Preg Test, Ur: NEGATIVE

## 2015-01-26 SURGERY — LIGATION, FALLOPIAN TUBE, LAPAROSCOPIC
Anesthesia: General | Site: Abdomen | Laterality: Bilateral

## 2015-01-26 MED ORDER — CITRIC ACID-SODIUM CITRATE 334-500 MG/5ML PO SOLN
ORAL | Status: AC
  Filled 2015-01-26: qty 15

## 2015-01-26 MED ORDER — OXYCODONE-ACETAMINOPHEN 5-325 MG PO TABS
1.0000 | ORAL_TABLET | Freq: Once | ORAL | Status: AC
  Administered 2015-01-26: 1 via ORAL

## 2015-01-26 MED ORDER — SCOPOLAMINE 1 MG/3DAYS TD PT72
MEDICATED_PATCH | TRANSDERMAL | Status: AC
  Administered 2015-01-26: 1.5 mg via TRANSDERMAL
  Filled 2015-01-26: qty 1

## 2015-01-26 MED ORDER — OXYCODONE-ACETAMINOPHEN 5-325 MG PO TABS: 1.0000 | ORAL_TABLET | Freq: Four times a day (QID) | ORAL | Status: DC | PRN

## 2015-01-26 MED ORDER — DOCUSATE SODIUM 100 MG PO CAPS: 100.0000 mg | ORAL_CAPSULE | Freq: Two times a day (BID) | ORAL | Status: DC | PRN

## 2015-01-26 MED ORDER — MEPERIDINE HCL 25 MG/ML IJ SOLN: 6.2500 mg | INTRAMUSCULAR | Status: DC | PRN

## 2015-01-26 MED ORDER — DEXAMETHASONE SODIUM PHOSPHATE 4 MG/ML IJ SOLN
INTRAMUSCULAR | Status: DC | PRN
  Administered 2015-01-26: 4 mg via INTRAVENOUS

## 2015-01-26 MED ORDER — OXYCODONE-ACETAMINOPHEN 5-325 MG PO TABS
ORAL_TABLET | ORAL | Status: DC
Start: 2015-01-26 — End: 2015-01-26
  Filled 2015-01-26: qty 1

## 2015-01-26 MED ORDER — KETOROLAC TROMETHAMINE 30 MG/ML IJ SOLN
INTRAMUSCULAR | Status: DC | PRN
  Administered 2015-01-26: 30 mg via INTRAVENOUS

## 2015-01-26 MED ORDER — FENTANYL CITRATE (PF) 250 MCG/5ML IJ SOLN
INTRAMUSCULAR | Status: AC
  Filled 2015-01-26: qty 5

## 2015-01-26 MED ORDER — LACTATED RINGERS IV SOLN: INTRAVENOUS | Status: DC

## 2015-01-26 MED ORDER — METOCLOPRAMIDE HCL 5 MG/ML IJ SOLN: 10.0000 mg | Freq: Once | INTRAMUSCULAR | Status: DC | PRN

## 2015-01-26 MED ORDER — CITRIC ACID-SODIUM CITRATE 334-500 MG/5ML PO SOLN
30.0000 mL | ORAL | Status: AC
  Administered 2015-01-26: 30 mL via ORAL

## 2015-01-26 MED ORDER — PROPOFOL 10 MG/ML IV BOLUS
INTRAVENOUS | Status: DC | PRN
  Administered 2015-01-26: 200 mg via INTRAVENOUS

## 2015-01-26 MED ORDER — ONDANSETRON HCL 4 MG/2ML IJ SOLN
INTRAMUSCULAR | Status: DC | PRN
  Administered 2015-01-26: 4 mg via INTRAVENOUS

## 2015-01-26 MED ORDER — ROCURONIUM BROMIDE 100 MG/10ML IV SOLN
INTRAVENOUS | Status: AC
  Filled 2015-01-26: qty 1

## 2015-01-26 MED ORDER — ONDANSETRON HCL 4 MG/2ML IJ SOLN
INTRAMUSCULAR | Status: AC
  Filled 2015-01-26: qty 2

## 2015-01-26 MED ORDER — FENTANYL CITRATE (PF) 100 MCG/2ML IJ SOLN
INTRAMUSCULAR | Status: DC | PRN
  Administered 2015-01-26 (×2): 100 ug via INTRAVENOUS
  Administered 2015-01-26: 50 ug via INTRAVENOUS

## 2015-01-26 MED ORDER — SUCCINYLCHOLINE CHLORIDE 20 MG/ML IJ SOLN
INTRAMUSCULAR | Status: DC | PRN
  Administered 2015-01-26: 100 mg via INTRAVENOUS

## 2015-01-26 MED ORDER — BUPIVACAINE HCL (PF) 0.5 % IJ SOLN
INTRAMUSCULAR | Status: DC | PRN
  Administered 2015-01-26: 30 mL

## 2015-01-26 MED ORDER — MIDAZOLAM HCL 5 MG/5ML IJ SOLN
INTRAMUSCULAR | Status: DC | PRN
  Administered 2015-01-26: 2 mg via INTRAVENOUS

## 2015-01-26 MED ORDER — LIDOCAINE HCL (CARDIAC) 20 MG/ML IV SOLN
INTRAVENOUS | Status: DC | PRN
Start: 2015-01-26 — End: 2015-01-26
  Administered 2015-01-26: 50 mg via INTRAVENOUS

## 2015-01-26 MED ORDER — PROPOFOL 10 MG/ML IV BOLUS
INTRAVENOUS | Status: AC
  Filled 2015-01-26: qty 20

## 2015-01-26 MED ORDER — SCOPOLAMINE 1 MG/3DAYS TD PT72
1.0000 | MEDICATED_PATCH | Freq: Once | TRANSDERMAL | Status: DC
  Administered 2015-01-26: 1.5 mg via TRANSDERMAL

## 2015-01-26 MED ORDER — FENTANYL CITRATE (PF) 100 MCG/2ML IJ SOLN
INTRAMUSCULAR | Status: AC
  Filled 2015-01-26: qty 2

## 2015-01-26 MED ORDER — LIDOCAINE HCL (CARDIAC) 20 MG/ML IV SOLN
INTRAVENOUS | Status: AC
  Filled 2015-01-26: qty 5

## 2015-01-26 MED ORDER — KETOROLAC TROMETHAMINE 30 MG/ML IJ SOLN
INTRAMUSCULAR | Status: AC
  Filled 2015-01-26: qty 1

## 2015-01-26 MED ORDER — LACTATED RINGERS IV SOLN
INTRAVENOUS | Status: DC
  Administered 2015-01-26 (×2): via INTRAVENOUS

## 2015-01-26 MED ORDER — FENTANYL CITRATE (PF) 100 MCG/2ML IJ SOLN
25.0000 ug | INTRAMUSCULAR | Status: DC | PRN
  Administered 2015-01-26: 50 ug via INTRAVENOUS

## 2015-01-26 MED ORDER — DEXAMETHASONE SODIUM PHOSPHATE 4 MG/ML IJ SOLN
INTRAMUSCULAR | Status: AC
  Filled 2015-01-26: qty 1

## 2015-01-26 MED ORDER — BUPIVACAINE HCL (PF) 0.5 % IJ SOLN
INTRAMUSCULAR | Status: AC
  Filled 2015-01-26: qty 30

## 2015-01-26 MED ORDER — IBUPROFEN 600 MG PO TABS: 600.0000 mg | ORAL_TABLET | Freq: Four times a day (QID) | ORAL | Status: DC | PRN

## 2015-01-26 MED ORDER — SUCCINYLCHOLINE CHLORIDE 20 MG/ML IJ SOLN
INTRAMUSCULAR | Status: AC
  Filled 2015-01-26: qty 1

## 2015-01-26 MED ORDER — MIDAZOLAM HCL 2 MG/2ML IJ SOLN
INTRAMUSCULAR | Status: AC
  Filled 2015-01-26: qty 2

## 2015-01-26 SURGICAL SUPPLY — 20 items
CATH ROBINSON RED A/P 16FR (CATHETERS) ×3 IMPLANT
CLIP FILSHIE TUBAL LIGA STRL (Clip) ×5 IMPLANT
CLOTH BEACON ORANGE TIMEOUT ST (SAFETY) ×3 IMPLANT
DRSG COVADERM PLUS 2X2 (GAUZE/BANDAGES/DRESSINGS) ×6 IMPLANT
DRSG OPSITE POSTOP 3X4 (GAUZE/BANDAGES/DRESSINGS) ×3 IMPLANT
DURAPREP 26ML APPLICATOR (WOUND CARE) ×3 IMPLANT
GLOVE BIOGEL PI IND STRL 7.0 (GLOVE) ×3 IMPLANT
GLOVE BIOGEL PI INDICATOR 7.0 (GLOVE) ×6
GLOVE ECLIPSE 7.0 STRL STRAW (GLOVE) ×3 IMPLANT
GOWN STRL REUS W/TWL LRG LVL3 (GOWN DISPOSABLE) ×6 IMPLANT
LIQUID BAND (GAUZE/BANDAGES/DRESSINGS) ×3 IMPLANT
NEEDLE HYPO 22GX1.5 SAFETY (NEEDLE) ×3 IMPLANT
PACK LAPAROSCOPY BASIN (CUSTOM PROCEDURE TRAY) ×3 IMPLANT
PAD POSITIONING PINK XL (MISCELLANEOUS) ×3 IMPLANT
SUT VIC AB 3-0 X1 27 (SUTURE) ×3 IMPLANT
SUT VICRYL 0 UR6 27IN ABS (SUTURE) ×3 IMPLANT
SYR 30ML LL (SYRINGE) ×3 IMPLANT
TOWEL OR 17X24 6PK STRL BLUE (TOWEL DISPOSABLE) ×6 IMPLANT
TROCAR XCEL NON-BLD 11X100MML (ENDOMECHANICALS) ×3 IMPLANT
WATER STERILE IRR 1000ML POUR (IV SOLUTION) ×3 IMPLANT

## 2015-01-26 NOTE — Discharge Instructions (Signed)
Laparoscopic Surgery - Care After Laparoscopy is a surgical procedure. It is used to diagnose and treat diseases inside the belly(abdomen). It is usually a brief, common, and relatively simple procedure. The laparoscopeis a thin, lighted, pencil-sized instrument. It is like a telescope. It is inserted into your abdomen through a small cut (incision). Your caregiver can look at the organs inside your body through this instrument.  She can see if there is anything abnormal. Laparoscopy can be done either in a hospital or outpatient clinic. You may be given a mild sedative to help you relax before the procedure. Once in the operating room, you will be given a drug to make you sleep (general anesthesia). Laparoscopy usually lasts about 1 hour. After the procedure, you will be monitored in a recovery area until you are stable and doing well. Once you are home, it may take 3 to 7 days to fully recover.  Laparoscopy has relatively few risks. Your caregiver will discuss the risks with you before the procedure. Some problems that can occur include: RISKS AND COMPLICATIONS  Allergies to medicines. Difficulty breathing. Bleeding. Infection. Damage to other surrounding structures HOME CARE INSTRUCTIONS  Infection. Bleeding. Damage to other organs. Anesthetic side effects.  Need for additional procedures such as open procedures/laparotomy PROCEDURE Once you receive anesthesia, your surgeon inflates the abdomen with a harmless gas (carbon dioxide). This makes the organs easier to see. The laparoscope is inserted into the abdomen through a small incision. This allows your surgeon to see into the abdomen. Other small instruments are also inserted into the abdomen through other small openings. Many surgeons attach a video camera to the laparoscope to enlarge the view. During a laparoscopy, the surgeon may be looking for inflammation, infection, or cancer.  The surgeon may also need to take out certain organs or  take tissue samples (biopsies). The specimens are sent to a specialist in looking at cells and tissue samples (pathologist). The pathologist examines them under a microscope to help to diagnose or confirm a disease. AFTER THE PROCEDURE  The incisions are closed with stitches (sutures) and Dermabond. Because these incisions are small (usually less than 1/2 inch), there is usually minimal discomfort after the procedure. There may also be discomfort from the instrument placement incisions in the abdomen. You will be given pain medicine to ease any discomfort. You will rest in a recovery room for 1-2 hours until you are stable and doing well. You may have some mild discomfort in the throat. This is from the tube placed in your throat while you were sleeping. You may experience discomfort in the shoulder area from some trapped air between the liver and diaphragm. This sensation is normal and will slowly go away on its own. The recovery time is shortened as long as there are no complications. You will rest in a recovery room until stable and doing well. As long as there are no complications, you may be allowed to go home. Someone will need to drive you home and be with you for at least 24 hours once home. FINDING OUT THE RESULTS You will be called with the results of the pathology and will discuss these results with  your caregiver during your postoperative appointment. Do not assume everything is normal if you have not heard from your caregiver or the medical facility. It is important for you to follow up on all of your results. HOME CARE INSTRUCTIONS  Take all medicines as directed. Only take over-the-counter or prescription medicines for pain, discomfort,   CARE INSTRUCTIONS   Take all medicines as directed.  Only take over-the-counter or prescription medicines for pain, discomfort, or fever as directed by your caregiver.  Resume daily activities as directed.  Showers are preferred over baths.  You may resume sexual activities in 1 week or as directed.  Do not drive while taking  narcotics. SEEK MEDICAL CARE IF:  There is increasing abdominal pain.  You feel lightheaded or faint.  You have the chills.  You have an oral temperature above 102 F (38.9 C).  There is pus-like (purulent) drainage from any of the wounds.  You are unable to pass gas or have a bowel movement.  You feel sick to your stomach (nauseous) or throw up (vomit). MAKE SURE YOU:   Understand these instructions.  Will watch your condition.  Will get help right away if you are not doing well or get worse.  ExitCare Patient Information 2013 [place]   Post Anesthesia Home Care Instructions  Activity: Get plenty of rest for the remainder of the day. A responsible adult should stay with you for 24 hours following the procedure.  For the next 24 hours, DO NOT: -Drive a car -Advertising copywriterperate machinery -Drink alcoholic beverages -Take any medication unless instructed by your physician -Make any legal decisions or sign important papers.  Meals: Start with liquid foods such as gelatin or soup. Progress to regular foods as tolerated. Avoid greasy, spicy, heavy foods. If nausea and/or vomiting occur, drink only clear liquids until the nausea and/or vomiting subsides. Call your physician if vomiting continues.  Special Instructions/Symptoms: Your throat may feel dry or sore from the anesthesia or the breathing tube placed in your throat during surgery. If this causes discomfort, gargle with warm salt water. The discomfort should disappear within 24 hours.  If you had a scopolamine patch placed behind your ear for the management of post- operative nausea and/or vomiting:  1. The medication in the patch is effective for 72 hours, after which it should be removed.  Wrap patch in a tissue and discard in the trash. Wash hands thoroughly with soap and water. 2. You may remove the patch earlier than 72 hours if you experience unpleasant side effects which may include dry mouth, dizziness or visual  disturbances. 3. Avoid touching the patch. Wash your hands with soap and water after contact with the patch.

## 2015-01-26 NOTE — Anesthesia Procedure Notes (Signed)
Procedure Name: Intubation Date/Time: 01/26/2015 9:27 AM Performed by: Jhonnie GarnerMARSHALL, Tianni Escamilla M Pre-anesthesia Checklist: Emergency Drugs available, Patient identified, Suction available and Patient being monitored Patient Re-evaluated:Patient Re-evaluated prior to inductionOxygen Delivery Method: Circle system utilized Preoxygenation: Pre-oxygenation with 100% oxygen Intubation Type: IV induction Ventilation: Mask ventilation without difficulty Laryngoscope Size: Miller and 2 Grade View: Grade I Tube type: Oral Tube size: 7.0 mm Number of attempts: 1 Airway Equipment and Method: Stylet Placement Confirmation: ETT inserted through vocal cords under direct vision,  positive ETCO2 and breath sounds checked- equal and bilateral Secured at: 21 cm Tube secured with: Tape Dental Injury: Teeth and Oropharynx as per pre-operative assessment

## 2015-01-26 NOTE — OR Nursing (Signed)
Straight cath used to empty bladder. No foley inserted

## 2015-01-26 NOTE — H&P (Signed)
Preoperative History and Physical  Keyra D Anselm Lisnoch is a 34 y.o. [id number] here for surgical management of undesired fertility.   No significant preoperative concerns.  Proposed surgery:  Laparoscopic bilateral sterilization using Filshie clips.  Past Medical History  Diagnosis Date  . Hypertension    Past Surgical History  Procedure Laterality Date  . Cesarean section    . Wisdom tooth extraction     OB History  Gravida Para Term Preterm AB SAB TAB Ectopic Multiple Living  4 2 2  0 2 0 2 0  2    # Outcome Date GA Lbr Len/2nd Weight Sex Delivery Anes PTL Lv  4 Term 08/2010    M VBAC     3 Term 05/1999    M CS-LTranv     2 TAB           1 TAB             Patient denies any other pertinent gynecologic issues.   No current facility-administered medications on file prior to encounter.   No current outpatient prescriptions on file prior to encounter.   Allergies  Allergen Reactions  . Hydrocodone Other (See Comments)    Headache   . Hydrocodone Itching    Social History:   reports that she has been smoking Cigarettes.  She has a 7.5 pack-year smoking history. She has never used smokeless tobacco. She reports that she drinks about 0.6 oz of alcohol per week. She reports that she uses illicit drugs (Marijuana).  Family History  Problem Relation Age of Onset  . Hypertension Mother   . Diabetes Mother   . Hyperlipidemia Mother   . Heart disease Mother     Review of Systems: Noncontributory  PHYSICAL EXAM: Blood pressure 110/94, pulse 98, temperature 97.8 F (36.6 C), temperature source Oral, resp. rate 18, last menstrual period 11/12/2014, SpO2 100 %. CONSTITUTIONAL: Well-developed, well-nourished female in no acute distress.  HENT:  Normocephalic, atraumatic, External right and left ear normal. Oropharynx is clear and moist EYES: Conjunctivae and EOM are normal. Pupils are equal, round, and reactive to light. No scleral icterus.  NECK: Normal range of motion, supple, no  masses SKIN: Skin is warm and dry. No rash noted. Not diaphoretic. No erythema. No pallor. NEUROLGIC: Alert and oriented to person, place, and time. Normal reflexes, muscle tone coordination. No cranial nerve deficit noted. PSYCHIATRIC: Normal mood and affect. Normal behavior. Normal judgment and thought content. CARDIOVASCULAR: Normal heart rate noted, regular rhythm RESPIRATORY: Effort and breath sounds normal, no problems with respiration noted ABDOMEN: Soft, nontender, nondistended. Well-healed Pfannensteil incision. PELVIC: Deferred MUSCULOSKELETAL: Normal range of motion. No edema and no tenderness. 2+ distal pulses.  Labs: Results for orders placed or performed during the hospital encounter of 01/26/15 (from the past 336 hour(s))  Pregnancy, urine   Collection Time: 01/26/15  8:15 AM  Result Value Ref Range   Preg Test, Ur NEGATIVE NEGATIVE  Results for orders placed or performed during the hospital encounter of 01/15/15 (from the past 336 hour(s))  CBC   Collection Time: 01/15/15 12:15 PM  Result Value Ref Range   WBC 8.3 4.0 - 10.5 K/uL   RBC 4.29 3.87 - 5.11 MIL/uL   Hemoglobin 13.3 12.0 - 15.0 g/dL   HCT 45.438.5 09.836.0 - 11.946.0 %   MCV 89.7 78.0 - 100.0 fL   MCH 31.0 26.0 - 34.0 pg   MCHC 34.5 30.0 - 36.0 g/dL   RDW 14.712.8 82.911.5 - 56.215.5 %   Platelets  238 150 - 400 K/uL    Assessment: Patient Active Problem List   Diagnosis Date Noted  . Encounter for female sterilization procedure 01/26/2015    Plan: Patient desires permanent sterilization, and will undergo surgical management with laparoscopic bilateral sterilization using Filshie clips.  Other reversible forms of contraception were discussed with patient; she declines all other modalities. Risks of procedure discussed with patient including but not limited to: risk of regret, permanence of method, bleeding, infection, injury to surrounding organs and need for additional procedures.  Failure risk of 1-2 % with increased risk of  ectopic gestation if pregnancy occurs was also discussed with patient.  Patient verbalized understanding of these risks and wants to proceed with sterilization.   The patient concurred with the proposed plan, giving informed written consent for the surgery.  Patient has been NPO since last night she will remain NPO for procedure.  Anesthesia and OR aware.  Preoperative prophylactic antibiotics and SCDs ordered on call to the OR.  To OR when ready.   Jaynie Collins, M.D. 01/26/2015 9:12 AM

## 2015-01-26 NOTE — Op Note (Signed)
[name] 01/26/2015  PREOPERATIVE DIAGNOSIS:  Undesired fertility  POSTOPERATIVE DIAGNOSIS:  Undesired fertility  PROCEDURE:  Laparoscopic Bilateral Tubal Sterilization using Filshie Clips   SURGEON: Jaynie CollinsUgonna Anyanwu, MD  ANESTHESIA:  General endotracheal  COMPLICATIONS:  None immediate.  ESTIMATED BLOOD LOSS:  5 ml.  FLUIDS: 1000 ml LR.  URINE OUTPUT:  10 ml of clear urine.  INDICATIONS: 34 y.o. [id number]  with undesired fertility, desires permanent sterilization. Other reversible forms of contraception were discussed with patient; she declines all other modalities.  Risks of procedure discussed with patient including permanence of method, bleeding, infection, injury to surrounding organs and need for additional procedures including laparotomy, risk of regret.  Failure risk of 1-2% with increased risk of ectopic gestation if pregnancy occurs was also discussed with patient.      FINDINGS:  Normal uterus, tubes, and ovaries.  TECHNIQUE:  The patient was taken to the operating room where general anesthesia was obtained without difficulty.  She was then placed in the dorsal lithotomy position and prepared and draped in sterile fashion.  After an adequate timeout was performed, a bivalved speculum was then placed in the patient's vagina, and the anterior lip of cervix grasped with the single-tooth tenaculum.  The uterine manipulator was then advanced into the uterus.  The speculum was removed from the vagina.  Attention was then turned to the patient's abdomen where a 11-mm skin incision was made in the umbilical fold.  The Optiview 11-mm trocar and sleeve were then advanced without difficulty with the laparoscope under direct visualization into the abdomen.  The abdomen was then insufflated with carbon dioxide gas and adequate pneumoperitoneum was obtained.  A survey of the patient's pelvis and abdomen revealed entirely normal anatomy.  The fallopian tubes were observed and found to be  normal in appearance. The Filshie clip applicator was placed through the operative port, and a Filshie clip was placed on the right fallopian tube ,about 2 cm from the cornual attachment, with care given to incorporate the underlying mesosalpinx.  A similar process was carried out on the contralateral side allowing for bilateral tubal sterilization.   Good hemostasis was noted overall.  Local analgesia was drizzled on both operative sites.The instruments were then removed from the patient's abdomen and the fascial incision was repaired with 0 Vicryl, and the skin was closed with 4-0 Vicryl and Dermabond.  The uterine manipulator and the tenaculum were removed from the vagina without complications. The patient tolerated the procedure well.  Sponge, lap, and needle counts were correct times two.  The patient was then taken to the recovery room awake, extubated and in stable condition.  The patient will be discharged to home as per PACU criteria.  Routine postoperative instructions given.  She was prescribed Percocet, Ibuprofen and Colace.  [facility]15 at 3:45 pm for postoperative evaluation.   Jaynie CollinsUGONNA  ANYANWU, MD, FACOG Attending Obstetrician & Gynecologist, [facility], [facility]

## 2015-01-26 NOTE — Anesthesia Postprocedure Evaluation (Signed)
Anesthesia Post Note  Patient: [name]  Procedure(s) Performed: Procedure(s) (LRB): LAPAROSCOPIC BILATERAL TUBAL LIGATION (Bilateral)  Patient location during evaluation: PACU Anesthesia Type: General Level of consciousness: sedated Pain management: pain level controlled Vital Signs Assessment: post-procedure vital signs reviewed and stable Respiratory status: spontaneous breathing Cardiovascular status: stable Postop Assessment: No signs of nausea or vomiting Anesthetic complications: no    Last Vitals:  Filed Vitals:   01/26/15 0813 01/26/15 1009  BP: 110/94 124/99  Pulse: 98 94  Temp: 36.6 C 37.1 C  Resp: 18 14    Last Pain: There were no vitals filed for this visit.               Harlowe Dowler JR,JOHN Susann GivensFRANKLIN

## 2015-01-26 NOTE — Transfer of Care (Signed)
Immediate Anesthesia Transfer of Care Note  Patient: [name]  Procedure(s) Performed: Procedure(s): LAPAROSCOPIC BILATERAL TUBAL LIGATION (Bilateral)  Patient Location: PACU  Anesthesia Type:General  Level of Consciousness: awake, alert  and oriented  Airway & Oxygen Therapy: Patient Spontanous Breathing and Patient connected to nasal cannula oxygen  Post-op Assessment: Report given to RN  Post vital signs: Reviewed  Last Vitals:  Filed Vitals:   01/26/15 0813  BP: 110/94  Pulse: 98  Temp: 36.6 C  Resp: 18    Complications: No apparent anesthesia complications

## 2015-01-27 ENCOUNTER — Encounter (HOSPITAL_COMMUNITY): Payer: Self-pay | Admitting: Obstetrics & Gynecology

## 2015-02-10 ENCOUNTER — Encounter: Payer: Self-pay | Admitting: Obstetrics & Gynecology

## 2015-02-20 ENCOUNTER — Ambulatory Visit (INDEPENDENT_AMBULATORY_CARE_PROVIDER_SITE_OTHER): Payer: Medicaid Other | Admitting: Obstetrics & Gynecology

## 2015-02-20 ENCOUNTER — Encounter: Payer: Self-pay | Admitting: Obstetrics & Gynecology

## 2015-02-20 VITALS — BP 142/100 | HR 84 | Ht 64.0 in | Wt 128.0 lb

## 2015-02-20 DIAGNOSIS — Z09 Encounter for follow-up examination after completed treatment for conditions other than malignant neoplasm: Secondary | ICD-10-CM

## 2015-02-20 DIAGNOSIS — I10 Essential (primary) hypertension: Secondary | ICD-10-CM

## 2015-02-20 NOTE — Patient Instructions (Signed)
Return to clinic for any scheduled appointments or for any gynecologic concerns as needed.   

## 2015-02-20 NOTE — Progress Notes (Signed)
  Subjective:     [name] is a 34 y.o. [phone] female who presents to the clinic 4 weeks status post Laparoscopic Bilateral Tubal Sterilization using Filshie Clips on 01/26/15. Eating a regular diet without difficulty. Bowel movements are normal. The patient is not having any pain. Concerned about one small area of her incision where she is unable to pull off the Dermabond remnant.  Past Medical History  Diagnosis Date  . Hypertension    Past Surgical History  Procedure Laterality Date  . Cesarean section    . Wisdom tooth extraction    . Laparoscopic tubal ligation Bilateral 01/26/2015    Procedure: LAPAROSCOPIC BILATERAL TUBAL LIGATION;  Surgeon: Tereso NewcomerUgonna A Kierstynn Babich, MD;  Location: [facility];  Service: Gynecology;  Laterality: Bilateral;    The following portions of the patient's history were reviewed and updated as appropriate: allergies, current medications, past family history, past medical history, past social history, past surgical history and problem list.    Health Maintenance: [facility]   Review of Systems Pertinent items noted in HPI and remainder of comprehensive ROS otherwise negative.    Objective:    BP 142/100 mmHg  Pulse 84  Ht 5\' 4"  (1.626 m)  Wt 128 lb (58.06 kg)  BMI 21.96 kg/m2  LMP 01/26/2015 General:  alert and no distress  Abdomen: soft, bowel sounds active, non-tender  Incision:   healing well, no drainage, no erythema, no hernia, no seroma, no swelling, no dehiscence, incision well approximated.  Small amount of Dermabond noted to be affixed to a suture at the end of the healed incision, this was grasped and excised.     Assessment:    Doing well postoperatively. Operative findings again reviewed.  Plan:   1. Continue any current medications. 2. Wound care discussed. 3. Activity restrictions: none 4. Anticipated return to work: now. [facility] 6. Follow up as needed for  any GYN concerns   Jaynie CollinsUGONNA  Bashar Milam, MD, FACOG Attending Obstetrician & Gynecologist, [facility]

## 2015-05-03 ENCOUNTER — Encounter (HOSPITAL_COMMUNITY): Payer: Self-pay | Admitting: *Deleted

## 2015-05-03 ENCOUNTER — Emergency Department (HOSPITAL_COMMUNITY)
Admission: EM | Admit: 2015-05-03 | Discharge: 2015-05-03 | Disposition: A | Discharge status: Home / Self Care | Payer: Medicaid Other | Attending: Emergency Medicine | Admitting: Emergency Medicine

## 2015-05-03 DIAGNOSIS — H6502 Acute serous otitis media, left ear: Secondary | ICD-10-CM

## 2015-05-03 DIAGNOSIS — I10 Essential (primary) hypertension: Secondary | ICD-10-CM | POA: Insufficient documentation

## 2015-05-03 DIAGNOSIS — F17210 Nicotine dependence, cigarettes, uncomplicated: Secondary | ICD-10-CM | POA: Diagnosis not present

## 2015-05-03 DIAGNOSIS — H65192 Other acute nonsuppurative otitis media, left ear: Secondary | ICD-10-CM | POA: Insufficient documentation

## 2015-05-03 DIAGNOSIS — H9202 Otalgia, left ear: Secondary | ICD-10-CM | POA: Diagnosis present

## 2015-05-03 MED ORDER — AMOXICILLIN 500 MG PO CAPS: 500.0000 mg | ORAL_CAPSULE | Freq: Two times a day (BID) | ORAL | Status: DC

## 2015-05-03 MED ORDER — BENZONATATE 100 MG PO CAPS: 100.0000 mg | ORAL_CAPSULE | Freq: Three times a day (TID) | ORAL | Status: DC

## 2015-05-03 NOTE — Discharge Instructions (Signed)

## 2015-05-03 NOTE — ED Provider Notes (Signed)
CSN: [id number]     Arrival date & time 05/03/15  1130 History   First MD Initiated Contact with Patient 05/03/15 1243     Chief Complaint  Patient presents with  . Otalgia    HPI Comments: 35 year old female presents with ear pain for 2 days. She reports associated cough which is worse at night and when lying flat and hearing loss which she describes as an "echo". Denies fever, ear drainage, congestion, sore throat, SOB, chest pain, or R ear pain. She has tried Ibuprofen for pain which has provided moderate relief.   Patient is a 35 y.o. female presenting with ear pain.  Otalgia Associated symptoms: cough and hearing loss   Associated symptoms: no fever, no rhinorrhea and no sore throat     Past Medical History  Diagnosis Date  . Hypertension    Past Surgical History  Procedure Laterality Date  . Cesarean section    . Wisdom tooth extraction    . Laparoscopic tubal ligation Bilateral 01/26/2015    Procedure: LAPAROSCOPIC BILATERAL TUBAL LIGATION;  Surgeon: Tereso Newcomer, MD;  Location: [facility];  Service: Gynecology;  Laterality: Bilateral;   Family History  Problem Relation Age of Onset  . Hypertension Mother   . Diabetes Mother   . Hyperlipidemia Mother   . Heart disease Mother    Social History  Substance Use Topics  . Smoking status: Current Every Day Smoker -- 0.50 packs/day for 15 years    Types: Cigarettes  . Smokeless tobacco: Never Used  . Alcohol Use: 0.6 oz/week    1 Glasses of wine per week     Comment: occasionally    OB History    Gravida Para Term Preterm AB TAB SAB Ectopic Multiple Living   0 2 2 0 0  2     Review of Systems  Constitutional: Negative for fever.  HENT: Positive for ear pain and hearing loss. Negative for rhinorrhea, sinus pressure and sore throat.        L ear pain  Respiratory: Positive for cough. Negative for shortness of breath.   Cardiovascular: Negative for chest pain.      Allergies  Hydrocodone and  Hydrocodone  Home Medications   Prior to Admission medications   Medication Sig Start Date End Date Taking? Authorizing Provider  amoxicillin (AMOXIL) 500 MG capsule Take 1 capsule (500 mg total) by mouth 2 (two) times daily. 05/03/15   Bethel Born, PA-C  benzonatate (TESSALON) 100 MG capsule Take 1 capsule (100 mg total) by mouth every 8 (eight) hours. 05/03/15   Bethel Born, PA-C  ibuprofen (ADVIL,MOTRIN) 600 MG tablet Take 1 tablet (600 mg total) by mouth every 6 (six) hours as needed. 01/26/15   Jethro Bastos Anyanwu, MD   BP 130/97 mmHg  Pulse 99  Temp(Src) 98 F (36.7 C) (Oral)  Resp 20  SpO2 100%  LMP 04/30/2015 Physical Exam  Constitutional: She appears well-developed and well-nourished. No distress.  HENT:  Head: Normocephalic and atraumatic.  Right Ear: Tympanic membrane, external ear and ear canal normal.  Left Ear: Ear canal normal. There is tenderness. No drainage or swelling. Tympanic membrane is injected and erythematous. A middle ear effusion is present.  Nose: Nose normal.  Mouth/Throat: Uvula is midline, oropharynx is clear and moist and mucous membranes are normal.    ED Course  Procedures (including critical care time)  Treat with Amoxicillin for 5 days and Tessalon for cough. Work note given.  Patient informed of clinical course, understand medical decision-making process, and agree with plan.   MDM   Final diagnoses:  Acute serous otitis media of left ear, recurrence not specified   Due to erythematous TM with effusion and cough, antibiotics indicated at this time to prevent possible complications.    Bethel BornKelly Marie Whitfield Dulay, PA-C 05/03/15 1639  Rolland PorterMark James, MD 05/11/15 Moses Manners0025

## 2015-05-03 NOTE — ED Notes (Signed)
Pt reports L ear pain x 3 days.  Pt reports muffled hearing as well.

## 2015-09-19 ENCOUNTER — Encounter (HOSPITAL_COMMUNITY): Payer: Self-pay

## 2015-09-19 ENCOUNTER — Emergency Department (HOSPITAL_COMMUNITY)
Admission: EM | Admit: 2015-09-19 | Discharge: 2015-09-19 | Disposition: A | Discharge status: Home / Self Care | Payer: Medicaid Other | Attending: Emergency Medicine | Admitting: Emergency Medicine

## 2015-09-19 ENCOUNTER — Emergency Department (HOSPITAL_COMMUNITY): Payer: Medicaid Other

## 2015-09-19 DIAGNOSIS — J029 Acute pharyngitis, unspecified: Secondary | ICD-10-CM | POA: Insufficient documentation

## 2015-09-19 DIAGNOSIS — Z791 Long term (current) use of non-steroidal anti-inflammatories (NSAID): Secondary | ICD-10-CM | POA: Diagnosis not present

## 2015-09-19 DIAGNOSIS — F17210 Nicotine dependence, cigarettes, uncomplicated: Secondary | ICD-10-CM | POA: Diagnosis not present

## 2015-09-19 DIAGNOSIS — I10 Essential (primary) hypertension: Secondary | ICD-10-CM | POA: Diagnosis not present

## 2015-09-19 DIAGNOSIS — F1290 Cannabis use, unspecified, uncomplicated: Secondary | ICD-10-CM | POA: Diagnosis not present

## 2015-09-19 DIAGNOSIS — R05 Cough: Secondary | ICD-10-CM | POA: Diagnosis present

## 2015-09-19 DIAGNOSIS — R52 Pain, unspecified: Secondary | ICD-10-CM | POA: Diagnosis not present

## 2015-09-19 MED ORDER — AMOXICILLIN 500 MG PO CAPS: 1000.0000 mg | ORAL_CAPSULE | Freq: Every day | ORAL | Status: DC

## 2015-09-19 MED ORDER — BENZONATATE 100 MG PO CAPS: 100.0000 mg | ORAL_CAPSULE | Freq: Three times a day (TID) | ORAL | Status: DC | PRN

## 2015-09-19 MED ORDER — NAPROXEN 500 MG PO TABS: 500.0000 mg | ORAL_TABLET | Freq: Two times a day (BID) | ORAL | Status: DC

## 2015-09-19 NOTE — ED Notes (Signed)
Bed: [facility] Expected date:  Expected time:  Means of arrival:  Comments: Hold 

## 2015-09-19 NOTE — ED Notes (Signed)
Patient c/o generalized body aches and a productive cough with blood-tinged yellow sputum

## 2015-09-19 NOTE — ED Notes (Signed)
Informed patient that there were two people ahead of patient, offered warm blanket and explained i would be getting vitals soon on patients

## 2015-09-19 NOTE — ED Provider Notes (Signed)
CSN: [id number]     Arrival date & time 09/19/15  1445 History   First MD Initiated Contact with Patient 09/19/15 1820     Chief Complaint  Patient presents with  . Generalized Body Aches  . Cough    (Consider location/radiation/quality/duration/timing/severity/associated sxs/prior Treatment) Patient is a 35 y.o. female presenting with cough. The history is provided by the patient and medical records. No language interpreter was used.  Cough Associated symptoms: myalgias and sore throat   Associated symptoms: no chest pain, no fever and no shortness of breath     [name] is a 35 y.o. female  with a PMH of HTN presents to the Emergency Department complaining of worsening sore throat and generalized body aches x 1 week. Associated symptoms include productive cough. She had one episode where she coughed up a quarter size of mucus which had a small amount of blood in it yesterday. Has had no more blood-tinged sputum since. Cough worse at night. Denies congestion, fever, chest pain, shortness of breath, n/v, back pain. No medications taken prior to arrival for symptoms.   Past Medical History  Diagnosis Date  . Hypertension    Past Surgical History  Procedure Laterality Date  . Cesarean section    . Wisdom tooth extraction    . Laparoscopic tubal ligation Bilateral 01/26/2015    Procedure: LAPAROSCOPIC BILATERAL TUBAL LIGATION;  Surgeon: Tereso Newcomer, MD;  Location: [facility];  Service: Gynecology;  Laterality: Bilateral;   Family History  Problem Relation Age of Onset  . Hypertension Mother   . Diabetes Mother   . Hyperlipidemia Mother   . Heart disease Mother    Social History  Substance Use Topics  . Smoking status: Current Every Day Smoker -- 0.50 packs/day for 15 years    Types: Cigarettes  . Smokeless tobacco: Never Used  . Alcohol Use: 0.6 oz/week    1 Glasses of wine per week     Comment: occasionally    OB History    Gravida Para Term Preterm AB TAB SAB  Ectopic Multiple Living   4 2 2  0 2 2 0 0  2     Review of Systems  Constitutional: Negative for fever.  HENT: Positive for sore throat. Negative for congestion.   Respiratory: Positive for cough. Negative for shortness of breath.   Cardiovascular: Negative for chest pain.  Gastrointestinal: Negative for nausea, vomiting, abdominal pain and diarrhea.  Musculoskeletal: Positive for myalgias.      Allergies  Hydrocodone and Hydrocodone  Home Medications   Prior to Admission medications   Medication Sig Start Date End Date Taking? Authorizing Provider  amoxicillin (AMOXIL) 500 MG capsule Take 2 capsules (1,000 mg total) by mouth daily. 09/19/15   Chase Picket Ramesh Moan, PA-C  benzonatate (TESSALON) 100 MG capsule Take 1 capsule (100 mg total) by mouth 3 (three) times daily as needed for cough. 09/19/15   Chase Picket Puneet Selden, PA-C  ibuprofen (ADVIL,MOTRIN) 600 MG tablet Take 1 tablet (600 mg total) by mouth every 6 (six) hours as needed. 01/26/15   Tereso Newcomer, MD  naproxen (NAPROSYN) 500 MG tablet Take 1 tablet (500 mg total) by mouth 2 (two) times daily. 09/19/15   Mariabelen Pressly Pilcher Nitara Szczerba, PA-C   BP 128/93 mmHg  Pulse 82  Temp(Src) 98.2 F (36.8 C) (Oral)  Resp 18  Ht 5\' 4"  (1.626 m)  Wt 58.968 kg  BMI 22.30 kg/m2  SpO2 98%  LMP 09/19/2015 Physical Exam  Constitutional: She is  oriented to person, place, and time. She appears well-developed and well-nourished. No distress.  HENT:  Head: Normocephalic and atraumatic.  OP with erythema and tonsillar hypertrophy. No exudates. No focal sinus tenderness.   Neck: Normal range of motion. Neck supple.  No meningeal signs.   Cardiovascular: Normal rate, regular rhythm and normal heart sounds.   Pulmonary/Chest: Effort normal.  Lungs are clear to auscultation bilaterally - no w/r/r  Abdominal: Soft. She exhibits no distension. There is no tenderness.  Musculoskeletal: Normal range of motion.  Lymphadenopathy:    She has cervical  adenopathy (Bilateral anterior).  Neurological: She is alert and oriented to person, place, and time.  Skin: Skin is warm and dry. She is not diaphoretic.  Nursing note and vitals reviewed.   ED Course  Procedures (including critical care time) Labs Review Labs Reviewed - No data to display  Imaging Review Dg Chest 2 View  09/19/2015  CLINICAL DATA:  Patient with shortness of breath and cough. EXAM: CHEST  2 VIEW COMPARISON:  Chest radiograph 06/11/2014 FINDINGS: Normal cardiac and mediastinal contours. No consolidative pulmonary opacities. No pleural effusion or pneumothorax. Regional skeleton is unremarkable. Nodular opacity projects over the right lower hemi thorax. IMPRESSION: No acute cardiopulmonary process. Probable nipple shadow projecting over the right lower hemi thorax. Consider confirmation with repeat radiograph and nipple markers. Electronically Signed   By: Annia Belt M.D.   On: 09/19/2015 18:03   I have personally reviewed and evaluated these images and lab results as part of my medical decision-making.   EKG Interpretation None      MDM   Final diagnoses:  Pharyngitis  Body aches   [name] presents to ED for sore throat, generalized body aches, and productive cough over the last week, acutely worsening yesterday. On exam, patient is afebrile with clear lung exam. CXR with no acute cardiopulmonary disease. OP with erythema and tonsillar hypertrophy. Discussed rapid strep, however patient states she would just like antibiotics and does not want to stay for further testing. Given duration of symptoms with acute worsening and OP findings, will treat with amoxil. Rx for tessalon and naproxen given as well. PCP follow up strongly recommended. Symptomatic home care instructions discussed. Return precautions discussed and all questions answered.   [facility] [name], PA-C 09/19/15 1905   Pricilla Loveless, MD 09/24/15 [id number]

## 2015-09-19 NOTE — Discharge Instructions (Signed)
1. Medications: Take antibiotic until completion, tessalon for cough, naproxen as needed for body aches, continue usual home medications 2. Treatment: rest, drink plenty of fluids, discard your toothbrush and begin using a new one in 3 days.  3. Follow Up: Please follow up with your primary doctor for discussion of your diagnoses and further evaluation after today's visit if symptoms persist longer than 7-10 days; Return to the ER for high fevers, inability to swallow, difficulty breathing or other concerning symptoms

## 2015-11-24 ENCOUNTER — Emergency Department (HOSPITAL_COMMUNITY)
Admission: EM | Admit: 2015-11-24 | Discharge: 2015-11-24 | Disposition: A | Discharge status: Home / Self Care | Payer: Medicaid Other | Attending: Emergency Medicine | Admitting: Emergency Medicine

## 2015-11-24 ENCOUNTER — Emergency Department (HOSPITAL_COMMUNITY): Payer: Medicaid Other

## 2015-11-24 ENCOUNTER — Encounter (HOSPITAL_COMMUNITY): Payer: Self-pay | Admitting: Emergency Medicine

## 2015-11-24 DIAGNOSIS — Z791 Long term (current) use of non-steroidal anti-inflammatories (NSAID): Secondary | ICD-10-CM | POA: Diagnosis not present

## 2015-11-24 DIAGNOSIS — J029 Acute pharyngitis, unspecified: Secondary | ICD-10-CM | POA: Diagnosis not present

## 2015-11-24 DIAGNOSIS — F17210 Nicotine dependence, cigarettes, uncomplicated: Secondary | ICD-10-CM | POA: Diagnosis not present

## 2015-11-24 DIAGNOSIS — I10 Essential (primary) hypertension: Secondary | ICD-10-CM | POA: Diagnosis not present

## 2015-11-24 DIAGNOSIS — F1290 Cannabis use, unspecified, uncomplicated: Secondary | ICD-10-CM | POA: Insufficient documentation

## 2015-11-24 LAB — CBC WITH DIFFERENTIAL/PLATELET
Basophils Absolute: 0 10*3/uL (ref 0.0–0.1)
Basophils Relative: 0 %
Eosinophils Absolute: 0.1 10*3/uL (ref 0.0–0.7)
Eosinophils Relative: 1 %
HCT: 36.1 % (ref 36.0–46.0)
Hemoglobin: 12 g/dL (ref 12.0–15.0)
Lymphocytes Relative: 16 %
Lymphs Abs: 1.4 10*3/uL (ref 0.7–4.0)
MCH: 30.2 pg (ref 26.0–34.0)
MCHC: 33.2 g/dL (ref 30.0–36.0)
MCV: 90.7 fL (ref 78.0–100.0)
Monocytes Absolute: 0.8 10*3/uL (ref 0.1–1.0)
Monocytes Relative: 10 %
Neutro Abs: 6.2 10*3/uL (ref 1.7–7.7)
Neutrophils Relative %: 73 %
Platelets: 178 10*3/uL (ref 150–400)
RBC: 3.98 MIL/uL (ref 3.87–5.11)
RDW: 12.9 % (ref 11.5–15.5)
WBC: 8.5 10*3/uL (ref 4.0–10.5)

## 2015-11-24 LAB — BASIC METABOLIC PANEL
Anion gap: 7 (ref 5–15)
BUN: 8 mg/dL (ref 6–20)
CO2: 24 mmol/L (ref 22–32)
Calcium: 9.1 mg/dL (ref 8.9–10.3)
Chloride: 105 mmol/L (ref 101–111)
Creatinine, Ser: 0.72 mg/dL (ref 0.44–1.00)
GFR calc Af Amer: >60 mL/min (ref >60)
GFR calc non Af Amer: >60 mL/min (ref >60)
Glucose, Bld: 93 mg/dL (ref 65–99)
Potassium: 3.7 mmol/L (ref 3.5–5.1)
Sodium: 136 mmol/L (ref 135–145)

## 2015-11-24 LAB — I-STAT BETA HCG BLOOD, ED (MC, WL, AP ONLY): I-stat hCG, quantitative: <5 m[IU]/mL (ref <5)

## 2015-11-24 LAB — RAPID STREP SCREEN (MED CTR MEBANE ONLY): Streptococcus, Group A Screen (Direct): NEGATIVE

## 2015-11-24 MED ORDER — ACETAMINOPHEN-CODEINE 120-12 MG/5ML PO SOLN: 10.0000 mL | ORAL | 0 refills | Status: DC | PRN

## 2015-11-24 MED ORDER — MORPHINE SULFATE (PF) 4 MG/ML IV SOLN
4.0000 mg | Freq: Once | INTRAVENOUS | Status: AC
  Administered 2015-11-24: 4 mg via INTRAVENOUS
  Filled 2015-11-24: qty 1

## 2015-11-24 MED ORDER — IOPAMIDOL (ISOVUE-300) INJECTION 61%
75.0000 mL | Freq: Once | INTRAVENOUS | Status: AC | PRN
  Administered 2015-11-24: 75 mL via INTRAVENOUS

## 2015-11-24 MED ORDER — CLINDAMYCIN HCL 150 MG PO CAPS: 450.0000 mg | ORAL_CAPSULE | Freq: Three times a day (TID) | ORAL | 0 refills | Status: DC

## 2015-11-24 MED ORDER — CLINDAMYCIN PHOSPHATE 900 MG/50ML IV SOLN
900.0000 mg | Freq: Once | INTRAVENOUS | Status: AC
  Administered 2015-11-24: 900 mg via INTRAVENOUS
  Filled 2015-11-24: qty 50

## 2015-11-24 MED ORDER — SODIUM CHLORIDE 0.9 % IV BOLUS (SEPSIS)
1000.0000 mL | Freq: Once | INTRAVENOUS | Status: AC
  Administered 2015-11-24: 1000 mL via INTRAVENOUS

## 2015-11-24 MED ORDER — HYDROMORPHONE HCL 1 MG/ML IJ SOLN
1.0000 mg | Freq: Once | INTRAMUSCULAR | Status: AC
  Administered 2015-11-24: 1 mg via INTRAVENOUS
  Filled 2015-11-24: qty 1

## 2015-11-24 NOTE — ED Notes (Signed)
PA at bedside.

## 2015-11-24 NOTE — ED Triage Notes (Signed)
Pt reports severe pain in throat and swelling on r/side of neck x 4 hours. C/o pressure on r/side of face and r/ear.

## 2015-11-24 NOTE — ED Provider Notes (Signed)
[facility]-EMERGENCY DEPT Provider Note   CSN: [id number] Arrival date & time: 11/24/15  1110     History   Chief Complaint Chief Complaint  Patient presents with  . Sore Throat    pressure ibn r/sideof throat x 4 hours  . Otalgia  . Dysphagia    can control secretions  . Headache    HPI [name] is a 35 y.o. female.  HPI Patient presents to the emergency department with throat pain.  Patient states she feels that the right side of her throat is swollen and causing pressure to her head.  Patient states that nothing seems make the condition better, swallowing makes the pain worse.  Patient states that she has not had any change in her voice, that she is notedThe patient denies chest pain, shortness of breath, headache,blurred vision, neck pain, fever, cough, weakness, numbness, dizziness, anorexia, edema, abdominal pain, nausea, vomiting, diarrhea, rash, back pain, dysuria, hematemesis, bloody stool, near syncope, or syncope. Past Medical History:  Diagnosis Date  . Hypertension     There are no active problems to display for this patient.   Past Surgical History:  Procedure Laterality Date  . CESAREAN SECTION    . LAPAROSCOPIC TUBAL LIGATION Bilateral 01/26/2015   Procedure: LAPAROSCOPIC BILATERAL TUBAL LIGATION;  Surgeon: Tereso NewcomerUgonna A Anyanwu, MD;  Location: [facility];  Service: Gynecology;  Laterality: Bilateral;  . WISDOM TOOTH EXTRACTION      OB History    Gravida Para Term Preterm AB Living   4 2 2  0 2 2   SAB TAB Ectopic Multiple Live Births   0 2 0           Home Medications    Prior to Admission medications   Medication Sig Start Date End Date Taking? Authorizing Provider  ibuprofen (ADVIL,MOTRIN) 200 MG tablet Take 600 mg by mouth every 6 (six) hours as needed for fever, headache, mild pain, moderate pain or cramping.   Yes Historical Provider, MD  amoxicillin (AMOXIL) 500 MG capsule Take 2 capsules (1,000 mg total) by mouth daily. Patient not taking: Reported  on 11/24/2015 09/19/15   [facility], PA[facility]  benzonatate (TESSALON) 100 MG capsule Take 1 capsule (100 mg total) by mouth 3 (three) times daily as needed for cough. Patient not taking: Reported on 11/24/2015 09/19/15   [facility], PA[facility]  naproxen (NAPROSYN) 500 MG tablet Take 1 tablet (500 mg total) by mouth 2 (two) times daily. Patient not taking: Reported on 11/24/2015 09/19/15   Chase PicketJaime Pilcher Ward, PA-C    Family History Family History  Problem Relation Age of Onset  . Hypertension Mother   . Diabetes Mother   . Hyperlipidemia Mother   . Heart disease Mother     Social History Social History  Substance Use Topics  . Smoking status: Current Every Day Smoker    Packs/day: 0.50    Years: 15.00    Types: Cigarettes  . Smokeless tobacco: Never Used  . Alcohol use 0.6 oz/week    1 Glasses of wine per week     Comment: occasionally      Allergies   Hydrocodone   Review of Systems Review of Systems All other systems negative except as documented in the HPI. All pertinent positives and negatives as reviewed in the HPI.  Physical Exam Updated Vital Signs BP 137/96 (BP Location: Right Arm)   Pulse 86   Temp 99.7 F (37.6 C) (Oral)   Resp 18   SpO2 100%  Physical Exam  Constitutional: She is oriented to person, place, and time. She appears well-developed and well-nourished. No distress.  HENT:  Head: Normocephalic and atraumatic.  Mouth/Throat: No trismus in the jaw. No uvula swelling. Posterior oropharyngeal edema and posterior oropharyngeal erythema present. No oropharyngeal exudate or tonsillar abscesses.  Eyes: Pupils are equal, round, and reactive to light.  Neck: Normal range of motion. Neck supple.  Cardiovascular: Normal rate, regular rhythm and normal heart sounds.  Exam reveals no gallop and no friction rub.   No murmur heard. Pulmonary/Chest: Effort normal and breath sounds normal. No respiratory distress. She has no wheezes.  Abdominal: Soft. Bowel  sounds are normal. She exhibits no distension. There is no tenderness.  Neurological: She is alert and oriented to person, place, and time. She exhibits normal muscle tone. Coordination normal.  Skin: Skin is warm and dry. No rash noted. No erythema.  Psychiatric: She has a normal mood and affect. Her behavior is normal.  Nursing note and vitals reviewed.    ED Treatments / Results  Labs (all labs ordered are listed, but only abnormal results are displayed) Labs Reviewed  RAPID STREP SCREEN (NOT AT [facility])  CULTURE, GROUP A STREP [facility])  BASIC METABOLIC PANEL  CBC WITH DIFFERENTIAL/PLATELET  I-STAT BETA HCG BLOOD, ED ([facility], [facility], [facility] ONLY)    EKG  EKG Interpretation None       Radiology Ct Soft Tissue Neck W Contrast  Result Date: 11/24/2015 CLINICAL DATA:  Severe throat pain and swelling on the right. Pressure in the right face and ear. EXAM: CT NECK WITH CONTRAST TECHNIQUE: Multidetector CT imaging of the neck was performed using the standard protocol following the bolus administration of intravenous contrast. CONTRAST:  75mL ISOVUE-300 IOPAMIDOL (ISOVUE-300) INJECTION 61% COMPARISON:  Head and cervical spine CT 12/31/2014 FINDINGS: Pharynx and larynx: The palatine tonsils are moderately enlarged, more so on the right where there is heterogeneous enhancement. No evidence of peritonsillar abscess. No retropharyngeal fluid. Unremarkable larynx. Salivary glands: Asymmetric right submandibular gland atrophy. 5 mm calculus in the right floor of mouth likely in the submandibular duct. No ductal dilatation or significant submandibular space inflammation. Unremarkable appearance of the left submandibular and both parotid glands. Thyroid: Unremarkable. Lymph nodes: Enlarged level II lymph nodes measure up to 1.6 cm in short axis on the right and 1.2 cm on the left, likely reactive. Level III lymph nodes measure up to 10 mm on the right and 9 mm on the left. There are subcentimeter level IV and V lymph  nodes bilaterally. Vascular: Major vascular structures of the neck appear patent. Limited intracranial: Unremarkable. Visualized orbits: Unremarkable. Mastoids and visualized paranasal sinuses: Clear. Skeleton: No acute or aggressive process. Upper chest: Unremarkable. Other: None. IMPRESSION: 1. Right greater than left tonsillar enlargement consistent with acute tonsillitis. No peritonsillar abscess. 2. Reactive cervical lymphadenopathy. 3. 5 mm right submandibular duct calculus with marked right submandibular gland atrophy. No evidence of significant active inflammatory change. Electronically Signed   By: Sebastian Ache M.D.   On: 11/24/2015 14:48    Procedures Procedures (including critical care time)  Medications Ordered in ED Medications  clindamycin (CLEOCIN) IVPB 900 mg (900 mg Intravenous New Bag/Given 11/24/15 1525)  sodium chloride 0.9 % bolus 1,000 mL (0 mLs Intravenous Stopped 11/24/15 1422)  morphine 4 MG/ML injection 4 mg (4 mg Intravenous Given 11/24/15 1242)  iopamidol (ISOVUE-300) 61 % injection 75 mL (75 mLs Intravenous Contrast Given 11/24/15 1405)  HYDROmorphone (DILAUDID) injection 1 mg (1 mg Intravenous Given  11/24/15 1524)     Initial Impression / Assessment and Plan / ED Course  I have reviewed the triage vital signs and the nursing notes.  Pertinent labs & imaging results that were available during my care of the patient were reviewed by me and considered in my medical decision making (see chart for details).  Clinical Course    Patient be treated with clindamycin IV and then sent home with clindamycin by mouth the patient will be advised to return here as needed.  Told to increase her fluid intake.  She is able to swallow and tolerate fluids here.  I advised her to return here for any worsening in her condition  Final Clinical Impressions(s) / ED Diagnoses   Final diagnoses:  None    New Prescriptions New Prescriptions   No medications on file     Charlestine Night, PA-C 11/24/15 1615    Linwood Dibbles, MD 11/25/15 [phone]

## 2015-11-24 NOTE — Discharge Instructions (Signed)
Return here as needed.  Slowly increase your fluid intake, rest as much as possible °

## 2015-11-26 ENCOUNTER — Encounter (HOSPITAL_COMMUNITY): Payer: Self-pay | Admitting: Emergency Medicine

## 2015-11-26 ENCOUNTER — Emergency Department (HOSPITAL_COMMUNITY)
Admission: EM | Admit: 2015-11-26 | Discharge: 2015-11-26 | Disposition: A | Discharge status: Home / Self Care | Payer: Medicaid Other | Attending: Physician Assistant | Admitting: Physician Assistant

## 2015-11-26 DIAGNOSIS — J029 Acute pharyngitis, unspecified: Secondary | ICD-10-CM | POA: Diagnosis present

## 2015-11-26 DIAGNOSIS — I10 Essential (primary) hypertension: Secondary | ICD-10-CM | POA: Diagnosis not present

## 2015-11-26 DIAGNOSIS — J039 Acute tonsillitis, unspecified: Secondary | ICD-10-CM | POA: Insufficient documentation

## 2015-11-26 DIAGNOSIS — F17210 Nicotine dependence, cigarettes, uncomplicated: Secondary | ICD-10-CM | POA: Insufficient documentation

## 2015-11-26 LAB — CULTURE, GROUP A STREP (THRC)

## 2015-11-26 LAB — BASIC METABOLIC PANEL
Anion gap: 7 (ref 5–15)
BUN: 6 mg/dL (ref 6–20)
CHLORIDE: 107 mmol/L (ref 101–111)
CO2: 22 mmol/L (ref 22–32)
Calcium: 8.6 mg/dL — ABNORMAL LOW (ref 8.9–10.3)
Creatinine, Ser: 0.64 mg/dL (ref 0.44–1.00)
GFR calc Af Amer: >60 mL/min (ref >60)
GFR calc non Af Amer: >60 mL/min (ref >60)
GLUCOSE: 86 mg/dL (ref 65–99)
POTASSIUM: 3.9 mmol/L (ref 3.5–5.1)
Sodium: 136 mmol/L (ref 135–145)

## 2015-11-26 LAB — CBC WITH DIFFERENTIAL/PLATELET
Basophils Absolute: 0 10*3/uL (ref 0.0–0.1)
Basophils Relative: 0 %
EOS PCT: 0 %
Eosinophils Absolute: 0 10*3/uL (ref 0.0–0.7)
HEMATOCRIT: 35.1 % — AB (ref 36.0–46.0)
HEMOGLOBIN: 11.7 g/dL — AB (ref 12.0–15.0)
LYMPHS ABS: 0.9 10*3/uL (ref 0.7–4.0)
LYMPHS PCT: 10 %
MCH: 29.8 pg (ref 26.0–34.0)
MCHC: 33.3 g/dL (ref 30.0–36.0)
MCV: 89.5 fL (ref 78.0–100.0)
MONO ABS: 1.4 10*3/uL — AB (ref 0.1–1.0)
Monocytes Relative: 15 %
Neutro Abs: 6.7 10*3/uL (ref 1.7–7.7)
Neutrophils Relative %: 75 %
Platelets: 163 10*3/uL (ref 150–400)
RBC: 3.92 MIL/uL (ref 3.87–5.11)
RDW: 12.9 % (ref 11.5–15.5)
WBC: 9 10*3/uL (ref 4.0–10.5)

## 2015-11-26 MED ORDER — HYDROMORPHONE HCL 1 MG/ML IJ SOLN
1.0000 mg | Freq: Once | INTRAMUSCULAR | Status: AC
  Administered 2015-11-26: 1 mg via INTRAVENOUS
  Filled 2015-11-26: qty 1

## 2015-11-26 MED ORDER — MORPHINE SULFATE (PF) 4 MG/ML IV SOLN
4.0000 mg | Freq: Once | INTRAVENOUS | Status: AC
  Administered 2015-11-26: 4 mg via INTRAVENOUS
  Filled 2015-11-26: qty 1

## 2015-11-26 MED ORDER — OXYCODONE-ACETAMINOPHEN 5-325 MG PO TABS: 1.0000 | ORAL_TABLET | Freq: Four times a day (QID) | ORAL | 0 refills | Status: DC | PRN

## 2015-11-26 MED ORDER — DEXAMETHASONE SODIUM PHOSPHATE 10 MG/ML IJ SOLN
10.0000 mg | Freq: Once | INTRAMUSCULAR | Status: AC
  Administered 2015-11-26: 10 mg via INTRAVENOUS
  Filled 2015-11-26: qty 1

## 2015-11-26 NOTE — Discharge Instructions (Signed)
It is very important that you continue taking your antibiotics 3 times a day as directed. Ibuprofen or tylenol as needed for mild to moderate pain. Take pain medication only as needed for severe pain - This can make you very drowsy - please do not drink alcohol, operate heavy machinery or drive on this medication.  Follow up with the ENT physician listed.  Return to ER for new or worsening symptoms, any additional concerns.

## 2015-11-26 NOTE — ED Provider Notes (Signed)
[facility]-EMERGENCY DEPT Provider Note   CSN: [id number] Arrival date & time: 11/26/15  1132   By signing my name below, I, [name], attest that this documentation has been prepared under the direction and in the presence of [facility], PA-C. Electronically Signed: Angelene Giovanni, ED Scribe. 11/26/15. 3:30 PM.   History   Chief Complaint Chief Complaint  Patient presents with  . Sore Throat    HPI Comments: [name] is a 35 y.o. female with a hx of hypertension who presents to the Emergency Department complaining of gradually worsening severe right sore throat with swelling onset 2 days ago when pt woke up in the morning. She reports associated intermittent headache described as a sharp pain. She also endorses chills and difficulty swallowing. She adds that she has not been able to eat or drink 2/2 pain but is able to swallow secreations. No alleviating factors noted. Pt was seen on 11/24/15 for these symptoms where she had a CT soft tissue neck with contrast which revealed right greater than left tonsillar enlargement consistent with acute tonsillitis and 5 mm right submandibular duct calculus with marked right submandibular gland atrophy. No abscess was appreciated on CT. She was treated with clindamycin IV at that time and discharged with clindamycin PO with instructions to increase fluid intake and return precautions. She states that she was able to take her clindamycin PO yesterday but is unable to take it today because it hurt to bad to swallow. She denies any fevers, nasal congestion, rhinorrhea, cough, or shortness of breath.    The history is provided by the patient. No language interpreter was used.    Past Medical History:  Diagnosis Date  . Hypertension     There are no active problems to display for this patient.   Past Surgical History:  Procedure Laterality Date  . CESAREAN SECTION    . LAPAROSCOPIC TUBAL LIGATION Bilateral 01/26/2015   Procedure:  LAPAROSCOPIC BILATERAL TUBAL LIGATION;  Surgeon: Tereso Newcomer, MD;  Location: [facility];  Service: Gynecology;  Laterality: Bilateral;  . WISDOM TOOTH EXTRACTION      OB History    Gravida Para Term Preterm AB Living   4 2 2  0 2 2   SAB TAB Ectopic Multiple Live Births   0 2 0           Home Medications    Prior to Admission medications   Medication Sig Start Date End Date Taking? Authorizing Provider  acetaminophen-codeine 120-12 MG/5ML solution Take 10 mLs by mouth every 4 (four) hours as needed for moderate pain. 11/24/15   Charlestine Night, PA-C  amoxicillin (AMOXIL) 500 MG capsule Take 2 capsules (1,000 mg total) by mouth daily. Patient not taking: Reported on 11/24/2015 09/19/15   [facility] Ward, PA-C  benzonatate (TESSALON) 100 MG capsule Take 1 capsule (100 mg total) by mouth 3 (three) times daily as needed for cough. Patient not taking: Reported on 11/24/2015 09/19/15   [facility] Ward, PA-C  clindamycin (CLEOCIN) 150 MG capsule Take 3 capsules (450 mg total) by mouth 3 (three) times daily. 11/24/15   Charlestine Night, PA-C  ibuprofen (ADVIL,MOTRIN) 200 MG tablet Take 600 mg by mouth every 6 (six) hours as needed for fever, headache, mild pain, moderate pain or cramping.    Historical Provider, MD  naproxen (NAPROSYN) 500 MG tablet Take 1 tablet (500 mg total) by mouth 2 (two) times daily. Patient not taking: Reported on 11/24/2015 09/19/15   Chase Picket Ward, PA-C  oxyCODONE-acetaminophen (PERCOCET/ROXICET) 5-325 MG tablet Take 1 tablet by mouth every 6 (six) hours as needed for severe pain. 11/26/15   Chase Picket Ward, PA-C    Family History Family History  Problem Relation Age of Onset  . Hypertension Mother   . Diabetes Mother   . Hyperlipidemia Mother   . Heart disease Mother     Social History Social History  Substance Use Topics  . Smoking status: Current Every Day Smoker    Packs/day: 0.50    Years: 15.00    Types: Cigarettes  . Smokeless tobacco:  Never Used  . Alcohol use 0.6 oz/week    1 Glasses of wine per week     Comment: occasionally      Allergies   Hydrocodone   Review of Systems Review of Systems  Constitutional: Positive for chills. Negative for fever.  HENT: Positive for sore throat and trouble swallowing. Negative for congestion and rhinorrhea.   Respiratory: Negative for cough and shortness of breath.      Physical Exam Updated Vital Signs BP 142/99 (BP Location: Left Arm)   Pulse 102   Temp 99.6 F (37.6 C) (Oral)   Resp 18   Ht 5\' 4"  (1.626 m)   Wt 56.7 kg   SpO2 100%   BMI 21.46 kg/m   Physical Exam  Constitutional: She is oriented to person, place, and time. She appears well-developed and well-nourished. No distress.  HENT:  Head: Normocephalic and atraumatic.  OP with erythema, edema, and exudates. No PTA. Midline uvula.   Neck:  No meningeal signs.  Cardiovascular: Normal heart sounds.   Mildly tachycardic but regular.  Pulmonary/Chest: Effort normal and breath sounds normal. No respiratory distress. She has no wheezes. She has no rales.  Abdominal: Soft. She exhibits no distension. There is no tenderness.  Lymphadenopathy:    She has cervical adenopathy.  Neurological: She is alert and oriented to person, place, and time.  Skin: Skin is warm and dry.  Nursing note and vitals reviewed.    ED Treatments / Results  DIAGNOSTIC STUDIES: Oxygen Saturation is 100% on RA, normal by my interpretation.    COORDINATION OF CARE: 3:14 PM- Pt advised of plan for treatment and pt agrees. Pt will receive Morphine 4 mg IV. Will review imaging results for further treatment.    Labs (all labs ordered are listed, but only abnormal results are displayed) Labs Reviewed  BASIC METABOLIC PANEL - Abnormal; Notable for the following:       Result Value   Calcium 8.6 (*)    All other components within normal limits  CBC WITH DIFFERENTIAL/PLATELET - Abnormal; Notable for the following:    Hemoglobin  11.7 (*)    HCT 35.1 (*)    Monocytes Absolute 1.4 (*)    All other components within normal limits    EKG  EKG Interpretation None       Radiology No results found.  Procedures Procedures (including critical care time)  Medications Ordered in ED Medications  morphine 4 MG/ML injection 4 mg (4 mg Intravenous Given 11/26/15 1532)  dexamethasone (DECADRON) injection 10 mg (10 mg Intravenous Given 11/26/15 1607)  HYDROmorphone (DILAUDID) injection 1 mg (1 mg Intravenous Given 11/26/15 1635)     Initial Impression / Assessment and Plan / ED Course  Elizabeth Sauer, PA-C has reviewed the triage vital signs and the nursing notes.  Pertinent labs & imaging results that were available during my care of the patient were reviewed by me and considered  in my medical decision making (see chart for details).  Clinical Course   Cassity D Anselm Lisnoch presents to ED for evaluation of sore throat. She was seen 2 days ago for the same and chart was reviewed from this visit. Rapid strep was performed and negative. Throat culture also negative. CT scan performed and imaging reviewed. Today on exam, no peritonsillar abscess noted. Labs drawn and reassuring. Normal white count. Patient given fluids, Decadron, and pain medication. On repeat evaluation, patient looks very much improved. She is eating oranges and drinking fluids without difficulty. Encouraged to continue taking antibiotic 3 times daily as directed. ENT follow-up provided and patient strongly encouraged to call in the morning for a follow-up appointment if she does not feel significantly better. Reasons to return to the ED were discussed and all questions were answered.  Patient seen by and discussed with Dr. Corlis LeakMackuen who agrees with treatment plan.    Final Clinical Impressions(s) / ED Diagnoses   Final diagnoses:  Tonsillitis    New Prescriptions New Prescriptions   OXYCODONE-ACETAMINOPHEN (PERCOCET/ROXICET) 5-325 MG TABLET    Take 1 tablet by  mouth every 6 (six) hours as needed for severe pain.   I personally performed the services described in this documentation, which was scribed in my presence. The recorded information has been reviewed and is accurate.    Chase PicketJaime Pilcher Ward, PA-C 11/26/15 1810    Courteney Randall AnLyn Mackuen, MD 11/28/15 2028

## 2015-11-26 NOTE — ED Triage Notes (Signed)
Pt complains of pain in the right side of throat, neck area, with it radiating to the jaw and ear on the right side.  Patient states she has not been able to eat or drink for the last couple of days.  Patient's throat looks red upon inspection; the right side of the neck/throat, under the ear feels hard.

## 2015-11-26 NOTE — Progress Notes (Addendum)
Pt is crying stating her throat is killing her. Pt stated she was here a few days ago and the codeine syrup did not help her throat. Pt was given a popsicle and some tissues. She does not appear in resp distress. There  appear to be a couple of canker sores in the back of the pts throat. The right side of pts neck is swollen where her lymph nodes are. IV 22 g started rt AC- up one liter of NSS. (3:10pm)

## 2016-04-21 ENCOUNTER — Encounter (HOSPITAL_COMMUNITY): Payer: Self-pay

## 2016-04-21 ENCOUNTER — Emergency Department (HOSPITAL_COMMUNITY)
Admission: EM | Admit: 2016-04-21 | Discharge: 2016-04-21 | Disposition: A | Discharge status: Home / Self Care | Payer: Medicaid Other | Attending: Emergency Medicine | Admitting: Emergency Medicine

## 2016-04-21 ENCOUNTER — Emergency Department (HOSPITAL_COMMUNITY): Payer: Medicaid Other

## 2016-04-21 DIAGNOSIS — Z79899 Other long term (current) drug therapy: Secondary | ICD-10-CM | POA: Insufficient documentation

## 2016-04-21 DIAGNOSIS — F17210 Nicotine dependence, cigarettes, uncomplicated: Secondary | ICD-10-CM | POA: Diagnosis not present

## 2016-04-21 DIAGNOSIS — I10 Essential (primary) hypertension: Secondary | ICD-10-CM | POA: Insufficient documentation

## 2016-04-21 DIAGNOSIS — J069 Acute upper respiratory infection, unspecified: Secondary | ICD-10-CM | POA: Insufficient documentation

## 2016-04-21 DIAGNOSIS — R05 Cough: Secondary | ICD-10-CM | POA: Diagnosis present

## 2016-04-21 MED ORDER — DIPHENHYDRAMINE HCL 25 MG PO CAPS
25.0000 mg | ORAL_CAPSULE | Freq: Once | ORAL | Status: AC
  Administered 2016-04-21: 25 mg via ORAL
  Filled 2016-04-21: qty 1

## 2016-04-21 MED ORDER — HYDROCODONE-ACETAMINOPHEN 5-325 MG PO TABS: ORAL_TABLET | ORAL | 0 refills | Status: DC

## 2016-04-21 MED ORDER — HYDROCODONE-ACETAMINOPHEN 5-325 MG PO TABS
1.0000 | ORAL_TABLET | Freq: Once | ORAL | Status: AC
  Administered 2016-04-21: 1 via ORAL
  Filled 2016-04-21: qty 1

## 2016-04-21 NOTE — Discharge Instructions (Signed)
Please follow with your primary care doctor in the next 2 days for a check-up. They must obtain records for further management.  ° °Do not hesitate to return to the Emergency Department for any new, worsening or concerning symptoms.  ° °

## 2016-04-21 NOTE — ED Provider Notes (Signed)
[facility]-EMERGENCY DEPT Provider Note   CSN: [id number] Arrival date & time: 04/21/16  1933 By signing my name below, I, [name], attest that this documentation has been prepared under the direction and in the presence of non-physician practitioner, Wynetta Emery, PA-C. Electronically Signed: Levon Graham, Scribe. 04/21/2016. 8:23 PM.   History   Chief Complaint Chief Complaint  Patient presents with  . Influenza    HPI [name] is a 36 y.o. female who presents to the Emergency Department complaining of intermittent, nonproductive cough onset two days ago. Per pt, she is unable to sleep due to the cough. She reports associated chest pain secondary to cough and headache. Pt has taken Mucinex and Motrin and used cough drops with no significant relief of symptoms. She is a current 1/2 pack per day smoker, and states she has not smoked since symptoms began. Pt denies any hx of blood clots. She denies runny nose, sore throat, fever, vomiting, leg swelling, calf pain, or any other associated symptoms.   The history is provided by the patient. No language interpreter was used.    Past Medical History:  Diagnosis Date  . Hypertension     There are no active problems to display for this patient.   Past Surgical History:  Procedure Laterality Date  . CESAREAN SECTION    . LAPAROSCOPIC TUBAL LIGATION Bilateral 01/26/2015   Procedure: LAPAROSCOPIC BILATERAL TUBAL LIGATION;  Surgeon: Tereso Newcomer, MD;  Location: [facility];  Service: Gynecology;  Laterality: Bilateral;  . WISDOM TOOTH EXTRACTION      OB History    Gravida Para Term Preterm AB Living   4 2 2  0 2 2   SAB TAB Ectopic Multiple Live Births   0 2 0          Home Medications    Prior to Admission medications   Medication Sig Start Date End Date Taking? Authorizing Provider  acetaminophen-codeine 120-12 MG/5ML solution Take 10 mLs by mouth every 4 (four) hours as needed for moderate pain. 11/24/15   Charlestine Night, PA-C  amoxicillin (AMOXIL) 500 MG capsule Take 2 capsules (1,000 mg total) by mouth daily. Patient not taking: Reported on 11/24/2015 09/19/15   [facility], PA-C  clindamycin (CLEOCIN) 150 MG capsule Take 3 capsules (450 mg total) by mouth 3 (three) times daily. 11/24/15   Charlestine Night, PA-C  HYDROcodone-acetaminophen (NORCO/VICODIN) 5-325 MG tablet Take 1-2 tablets by mouth every 6 hours as needed for pain and/or cough. 04/21/16   Zaneta Lightcap, PA-C  ibuprofen (ADVIL,MOTRIN) 200 MG tablet Take 600 mg by mouth every 6 (six) hours as needed for fever, headache, mild pain, moderate pain or cramping.    Historical Provider, MD  naproxen (NAPROSYN) 500 MG tablet Take 1 tablet (500 mg total) by mouth 2 (two) times daily. Patient not taking: Reported on 11/24/2015 09/19/15   Chase Picket Ward, PA-C    Family History Family History  Problem Relation Age of Onset  . Hypertension Mother   . Diabetes Mother   . Hyperlipidemia Mother   . Heart disease Mother     Social History Social History  Substance Use Topics  . Smoking status: Current Every Day Smoker    Packs/day: 0.50    Years: 15.00    Types: Cigarettes  . Smokeless tobacco: Never Used  . Alcohol use 0.6 oz/week    1 Glasses of wine per week     Comment: occasionally      Allergies   Hydrocodone  and Tramadol   Review of Systems Review of Systems 10 systems reviewed and all are negative for acute change except as noted in the HPI.  Physical Exam Updated Vital Signs BP (!) 141/109 (BP Location: Right Arm)   Pulse 94   Temp 99.6 F (37.6 C) (Oral)   Resp 16   Ht 5\' 6"  (1.676 m)   Wt 56.7 kg   LMP 04/21/2016   SpO2 96%   BMI 20.18 kg/m   Physical Exam  Constitutional: She is oriented to person, place, and time. She appears well-developed and well-nourished. No distress.  HENT:  Head: Normocephalic and atraumatic.  Right Ear: External ear normal.  Left Ear: External ear normal.  Mouth/Throat:  Oropharynx is clear and moist. No oropharyngeal exudate.  No drooling or stridor. Posterior pharynx mildly erythematous no significant tonsillar hypertrophy. No exudate. Soft palate rises symmetrically. No TTP or induration under tongue.   No tenderness to palpation of frontal or bilateral maxillary sinuses.  Mild mucosal edema in the nares with scant rhinorrhea.  Bilateral tympanic membranes with normal architecture and good light reflex.    Eyes: Conjunctivae and EOM are normal. Pupils are equal, round, and reactive to light.  Neck: Normal range of motion. Neck supple.  Cardiovascular: Normal rate and regular rhythm.   Pulmonary/Chest: Effort normal and breath sounds normal. No stridor. No respiratory distress. She has no wheezes. She has no rales. She exhibits no tenderness.  Abdominal: Soft. She exhibits no distension. There is no tenderness. There is no rebound and no guarding.  Neurological: She is alert and oriented to person, place, and time.  Skin: Skin is warm and dry.  Psychiatric: She has a normal mood and affect.  Nursing note and vitals reviewed.   ED Treatments / Results  DIAGNOSTIC STUDIES:  Oxygen Saturation is 99% on RA, normal by my interpretation.    COORDINATION OF CARE:  8:21 PM Discussed treatment plan with pt at bedside and pt agreed to plan.   Labs (all labs ordered are listed, but only abnormal results are displayed) Labs Reviewed - No data to display  EKG  EKG Interpretation None       Radiology Dg Chest 2 View  Result Date: 04/21/2016 CLINICAL DATA:  Cough, mid chest pain EXAM: CHEST  2 VIEW COMPARISON:  09/19/2015 FINDINGS: Normal mediastinum and cardiac silhouette. Normal pulmonary vasculature. No evidence of effusion, infiltrate, or pneumothorax. No acute bony abnormality. IMPRESSION: No acute cardiopulmonary process. Electronically Signed   By: Genevive BiStewart  Edmunds M.D.   On: 04/21/2016 20:14    Procedures Procedures (including critical care  time)  Medications Ordered in ED Medications  HYDROcodone-acetaminophen (NORCO/VICODIN) 5-325 MG per tablet 1 tablet (1 tablet Oral Given 04/21/16 2103)  diphenhydrAMINE (BENADRYL) capsule 25 mg (25 mg Oral Given 04/21/16 2103)     Initial Impression / Assessment and Plan / ED Course  I have reviewed the triage vital signs and the nursing notes.  Pertinent labs & imaging results that were available during my care of the patient were reviewed by me and considered in my medical decision making (see chart for details).     Vitals:   04/21/16 1929 04/21/16 1934 04/21/16 2216  BP:  136/91 (!) 141/109  Pulse:  95 94  Resp:  18 16  Temp:  99.6 F (37.6 C)   TempSrc:  Oral   SpO2:  99% 96%  Weight: 56.7 kg    Height: 5\' 6"  (1.676 m)      Medications  HYDROcodone-acetaminophen (NORCO/VICODIN) 5-325 MG per tablet 1 tablet (1 tablet Oral Given 04/21/16 2103)  diphenhydrAMINE (BENADRYL) capsule 25 mg (25 mg Oral Given 04/21/16 2103)    Alizay D Giovannini is 36 y.o. female presenting with NAD, Non-toxic appearing, AFVSS, LSCTA. Likely viral illness, advised patient to push fluids, over-the-counter medications for symptom relief.   Chest x-ray is without infiltrate. Low risk by well's score and PERC negative. Likely viral URI. Patient given Vicodin for cough suppression  Evaluation does not show pathology that would require ongoing emergent intervention or inpatient treatment. Pt is hemodynamically stable and mentating appropriately. Discussed findings and plan with patient/guardian, who agrees with care plan. All questions answered. Return precautions discussed and outpatient follow up given.    Final Clinical Impressions(s) / ED Diagnoses   Final diagnoses:  Upper respiratory tract infection, unspecified type    New Prescriptions Discharge Medication List as of 04/21/2016  9:53 PM    START taking these medications   Details  HYDROcodone-acetaminophen (NORCO/VICODIN) 5-325 MG tablet Take  1-2 tablets by mouth every 6 hours as needed for pain and/or cough., Print       I personally performed the services described in this documentation, which was scribed in my presence. The recorded information has been reviewed and is accurate.    Joni Reining Ijanae Macapagal, PA-C 04/22/16 0003    Pricilla Loveless, MD 04/22/16 [phone]

## 2016-04-21 NOTE — ED Triage Notes (Signed)
Flu like sx for 2 days non productive cough no fever and hurts in chest with breathing.

## 2018-06-21 ENCOUNTER — Emergency Department: Payer: Medicaid Other

## 2018-06-21 ENCOUNTER — Encounter: Payer: Self-pay | Admitting: Emergency Medicine

## 2018-06-21 ENCOUNTER — Inpatient Hospital Stay
Admission: EM | Admit: 2018-06-21 | Discharge: 2018-06-25 | DRG: 872 | Disposition: A | Discharge status: Home / Self Care | Payer: Medicaid Other | Attending: Internal Medicine | Admitting: Internal Medicine

## 2018-06-21 ENCOUNTER — Other Ambulatory Visit: Payer: Self-pay

## 2018-06-21 DIAGNOSIS — M545 Low back pain: Secondary | ICD-10-CM | POA: Diagnosis present

## 2018-06-21 DIAGNOSIS — I10 Essential (primary) hypertension: Secondary | ICD-10-CM | POA: Diagnosis present

## 2018-06-21 DIAGNOSIS — G43109 Migraine with aura, not intractable, without status migrainosus: Secondary | ICD-10-CM | POA: Diagnosis present

## 2018-06-21 DIAGNOSIS — M542 Cervicalgia: Secondary | ICD-10-CM | POA: Diagnosis present

## 2018-06-21 DIAGNOSIS — R000 Tachycardia, unspecified: Secondary | ICD-10-CM | POA: Diagnosis present

## 2018-06-21 DIAGNOSIS — N12 Tubulo-interstitial nephritis, not specified as acute or chronic: Secondary | ICD-10-CM

## 2018-06-21 DIAGNOSIS — Z8349 Family history of other endocrine, nutritional and metabolic diseases: Secondary | ICD-10-CM

## 2018-06-21 DIAGNOSIS — N10 Acute tubulo-interstitial nephritis: Secondary | ICD-10-CM | POA: Diagnosis present

## 2018-06-21 DIAGNOSIS — E876 Hypokalemia: Secondary | ICD-10-CM | POA: Diagnosis present

## 2018-06-21 DIAGNOSIS — Z833 Family history of diabetes mellitus: Secondary | ICD-10-CM

## 2018-06-21 DIAGNOSIS — A419 Sepsis, unspecified organism: Secondary | ICD-10-CM

## 2018-06-21 DIAGNOSIS — A4151 Sepsis due to Escherichia coli [E. coli]: Principal | ICD-10-CM | POA: Diagnosis present

## 2018-06-21 DIAGNOSIS — Z8249 Family history of ischemic heart disease and other diseases of the circulatory system: Secondary | ICD-10-CM

## 2018-06-21 DIAGNOSIS — F17210 Nicotine dependence, cigarettes, uncomplicated: Secondary | ICD-10-CM | POA: Diagnosis present

## 2018-06-21 DIAGNOSIS — R51 Headache: Secondary | ICD-10-CM | POA: Diagnosis not present

## 2018-06-21 LAB — CBC WITH DIFFERENTIAL/PLATELET
Abs Immature Granulocytes: 0.12 10*3/uL — ABNORMAL HIGH (ref 0.00–0.07)
Basophils Absolute: 0.1 10*3/uL (ref 0.0–0.1)
Basophils Relative: 0 %
Eosinophils Absolute: 0.1 10*3/uL (ref 0.0–0.5)
Eosinophils Relative: 0 %
HCT: 38.7 % (ref 36.0–46.0)
Hemoglobin: 12.8 g/dL (ref 12.0–15.0)
Immature Granulocytes: 1 %
Lymphocytes Relative: 5 %
Lymphs Abs: 1 10*3/uL (ref 0.7–4.0)
MCH: 29.4 pg (ref 26.0–34.0)
MCHC: 33.1 g/dL (ref 30.0–36.0)
MCV: 88.8 fL (ref 80.0–100.0)
Monocytes Absolute: 1.6 10*3/uL — ABNORMAL HIGH (ref 0.1–1.0)
Monocytes Relative: 7 %
Neutro Abs: 19.1 10*3/uL — ABNORMAL HIGH (ref 1.7–7.7)
Neutrophils Relative %: 87 %
Platelets: 259 10*3/uL (ref 150–400)
RBC: 4.36 MIL/uL (ref 3.87–5.11)
RDW: 13.2 % (ref 11.5–15.5)
WBC: 22 10*3/uL — ABNORMAL HIGH (ref 4.0–10.5)
nRBC: 0 % (ref 0.0–0.2)

## 2018-06-21 LAB — COMPREHENSIVE METABOLIC PANEL
ALT: 13 U/L (ref 0–44)
AST: 18 U/L (ref 15–41)
Albumin: 4.5 g/dL (ref 3.5–5.0)
Alkaline Phosphatase: 91 U/L (ref 38–126)
Anion gap: 13 (ref 5–15)
BUN: 9 mg/dL (ref 6–20)
CO2: 21 mmol/L — ABNORMAL LOW (ref 22–32)
Calcium: 9.2 mg/dL (ref 8.9–10.3)
Chloride: 103 mmol/L (ref 98–111)
Creatinine, Ser: 0.69 mg/dL (ref 0.44–1.00)
GFR calc Af Amer: >60 mL/min (ref >60)
GFR calc non Af Amer: >60 mL/min (ref >60)
Glucose, Bld: 106 mg/dL — ABNORMAL HIGH (ref 70–99)
Potassium: 3.4 mmol/L — ABNORMAL LOW (ref 3.5–5.1)
Sodium: 137 mmol/L (ref 135–145)
Total Bilirubin: 0.7 mg/dL (ref 0.3–1.2)
Total Protein: 8.8 g/dL — ABNORMAL HIGH (ref 6.5–8.1)

## 2018-06-21 LAB — URINALYSIS, COMPLETE (UACMP) WITH MICROSCOPIC
Bilirubin Urine: NEGATIVE
Glucose, UA: NEGATIVE mg/dL
Ketones, ur: 80 mg/dL — AB
Nitrite: NEGATIVE
Protein, ur: 100 mg/dL — AB
Specific Gravity, Urine: 1.03 (ref 1.005–1.030)
pH: 5 (ref 5.0–8.0)

## 2018-06-21 LAB — INFLUENZA PANEL BY PCR (TYPE A & B)
Influenza A By PCR: NEGATIVE
Influenza B By PCR: NEGATIVE

## 2018-06-21 LAB — GROUP A STREP BY PCR: Group A Strep by PCR: NOT DETECTED

## 2018-06-21 MED ORDER — SODIUM CHLORIDE 0.9 % IV SOLN
1.0000 g | Freq: Once | INTRAVENOUS | Status: AC
  Administered 2018-06-21: 1 g via INTRAVENOUS
  Filled 2018-06-21: qty 10

## 2018-06-21 MED ORDER — SODIUM CHLORIDE 0.9 % IV BOLUS
500.0000 mL | Freq: Once | INTRAVENOUS | Status: AC
  Administered 2018-06-21: 20:00:00 500 mL via INTRAVENOUS

## 2018-06-21 MED ORDER — DIPHENHYDRAMINE HCL 50 MG/ML IJ SOLN
25.0000 mg | Freq: Once | INTRAMUSCULAR | Status: AC
  Administered 2018-06-21: 21:00:00 25 mg via INTRAVENOUS
  Filled 2018-06-21: qty 1

## 2018-06-21 MED ORDER — PROCHLORPERAZINE EDISYLATE 10 MG/2ML IJ SOLN
10.0000 mg | Freq: Once | INTRAMUSCULAR | Status: AC
  Administered 2018-06-21: 21:00:00 10 mg via INTRAVENOUS
  Filled 2018-06-21: qty 2

## 2018-06-21 MED ORDER — KETOROLAC TROMETHAMINE 30 MG/ML IJ SOLN
15.0000 mg | Freq: Once | INTRAMUSCULAR | Status: AC
  Administered 2018-06-21: 19:00:00 15 mg via INTRAVENOUS
  Filled 2018-06-21: qty 1

## 2018-06-21 MED ORDER — IOHEXOL 300 MG/ML  SOLN
100.0000 mL | Freq: Once | INTRAMUSCULAR | Status: AC | PRN
  Administered 2018-06-21: 21:00:00 100 mL via INTRAVENOUS
  Filled 2018-06-21: qty 100

## 2018-06-21 MED ORDER — SODIUM CHLORIDE 0.9 % IV BOLUS
1000.0000 mL | Freq: Once | INTRAVENOUS | Status: AC
  Administered 2018-06-21: 19:00:00 1000 mL via INTRAVENOUS

## 2018-06-21 MED ORDER — ACETAMINOPHEN 500 MG PO TABS
1000.0000 mg | ORAL_TABLET | Freq: Once | ORAL | Status: AC
  Administered 2018-06-21: 1000 mg via ORAL
  Filled 2018-06-21: qty 2

## 2018-06-21 MED ORDER — MORPHINE SULFATE (PF) 2 MG/ML IV SOLN
2.0000 mg | Freq: Once | INTRAVENOUS | Status: AC
  Administered 2018-06-22: 2 mg via INTRAVENOUS
  Filled 2018-06-21: qty 1

## 2018-06-21 NOTE — ED Triage Notes (Signed)
Pt in via POV, reports sudden onset fever and generalized body aches since last night.  Pt tearful, ambulatory to room.

## 2018-06-21 NOTE — ED Notes (Signed)
Hospitalist to bedside.

## 2018-06-21 NOTE — ED Provider Notes (Signed)
[facility] Emergency Department Provider Note    First MD Initiated Contact with Patient 06/21/18 1754     (approximate)  I have reviewed the triage vital signs and the nursing notes.   HISTORY  Chief Complaint Fever and Generalized Body Aches    HPI [name] is a 38 y.o. female close past medical history presents the ER for fever myalgias low back pain and headache for the past 24 hours.  Started at work last night.  Patient states she is having increased urinary frequency with darker urine but denies any dysuria.  She is having bilateral flank pain.  States that headache developed this afternoon.  Has not taken anything for pain.  She is been eating and drinking without any vomiting.  Denies any anterior abdominal pain.  No known sick contacts.  She has had a nonproductive cough.  Also worried that might be related to her enlarged tonsils.    Past Medical History:  Diagnosis Date  . Hypertension    Family History  Problem Relation Age of Onset  . Hypertension Mother   . Diabetes Mother   . Hyperlipidemia Mother   . Heart disease Mother    Past Surgical History:  Procedure Laterality Date  . CESAREAN SECTION    . LAPAROSCOPIC TUBAL LIGATION Bilateral 01/26/2015   Procedure: LAPAROSCOPIC BILATERAL TUBAL LIGATION;  Surgeon: Tereso Newcomer, MD;  Location: [facility];  Service: Gynecology;  Laterality: Bilateral;  . WISDOM TOOTH EXTRACTION     There are no active problems to display for this patient.     Prior to Admission medications   Medication Sig Start Date End Date Taking? Authorizing Provider  acetaminophen-codeine 120-12 MG/5ML solution Take 10 mLs by mouth every 4 (four) hours as needed for moderate pain. 11/24/15   Lawyer, Cristal Deer, PA-C  amoxicillin (AMOXIL) 500 MG capsule Take 2 capsules (1,000 mg total) by mouth daily. Patient not taking: Reported on 11/24/2015 09/19/15   Ward, Chase Picket, PA-C  clindamycin (CLEOCIN) 150 MG  capsule Take 3 capsules (450 mg total) by mouth 3 (three) times daily. 11/24/15   Lawyer, Cristal Deer, PA-C  HYDROcodone-acetaminophen (NORCO/VICODIN) 5-325 MG tablet Take 1-2 tablets by mouth every 6 hours as needed for pain and/or cough. 04/21/16   Pisciotta, Joni Reining, PA-C  ibuprofen (ADVIL,MOTRIN) 200 MG tablet Take 600 mg by mouth every 6 (six) hours as needed for fever, headache, mild pain, moderate pain or cramping.    [provider]  naproxen (NAPROSYN) 500 MG tablet Take 1 tablet (500 mg total) by mouth 2 (two) times daily. Patient not taking: Reported on 11/24/2015 09/19/15   Ward, Chase Picket, PA-C    Allergies Hydrocodone and Tramadol    Social History Social History   Tobacco Use  . Smoking status: Current Every Day Smoker    Packs/day: 0.50    Years: 15.00    Pack years: 7.50    Types: Cigarettes  . Smokeless tobacco: Never Used  Substance Use Topics  . Alcohol use: Yes    Alcohol/week: 1.0 standard drinks    Types: 1 Glasses of wine per week    Comment: occasionally   . Drug use: Yes    Types: Marijuana    Review of Systems Patient denies headaches, rhinorrhea, blurry vision, numbness, shortness of breath, chest pain, edema, cough, abdominal pain, nausea, vomiting, diarrhea, dysuria, fevers, rashes or hallucinations unless otherwise stated above in HPI. ____________________________________________   PHYSICAL EXAM:  VITAL SIGNS: Vitals:   06/21/18 1740  06/21/18 2025  BP: (!) 126/92 128/88  Pulse: (!) 109 (!) 114  Resp: (!) 22 20  Temp: 99.7 F (37.6 C) (!) 101.4 F (38.6 C)  SpO2: 100% 100%    Constitutional: Alert and oriented.  Eyes: Conjunctivae are normal.  Head: Atraumatic. Nose: No congestion/rhinnorhea. Mouth/Throat: Mucous membranes are moist.   Neck: No stridor. Painless ROM.  Cardiovascular: Normal rate, regular rhythm. Grossly normal heart sounds.  Good peripheral circulation. Respiratory: Normal respiratory effort.  No  retractions. Lungs CTAB. Gastrointestinal: Soft and nontender. No distention. No abdominal bruits. No CVA tenderness. Genitourinary:  Musculoskeletal: No lower extremity tenderness nor edema.  No joint effusions. Neurologic:  Normal speech and language. No gross focal neurologic deficits are appreciated. No facial droop Skin:  Skin is warm, dry and intact. No rash noted. Psychiatric: Mood and affect are normal. Speech and behavior are normal.  ____________________________________________   LABS (all labs ordered are listed, but only abnormal results are displayed)  Results for orders placed or performed during the hospital encounter of 06/21/18 (from the past 24 hour(s))  CBC with Differential/Platelet     Status: Abnormal   Collection Time: 06/21/18  7:02 PM  Result Value Ref Range   WBC 22.0 (H) 4.0 - 10.5 K/uL   RBC 4.36 3.87 - 5.11 MIL/uL   Hemoglobin 12.8 12.0 - 15.0 g/dL   HCT 16.138.7 09.636.0 - 04.546.0 %   MCV 88.8 80.0 - 100.0 fL   MCH 29.4 26.0 - 34.0 pg   MCHC 33.1 30.0 - 36.0 g/dL   RDW 40.913.2 81.111.5 - 91.415.5 %   Platelets 259 150 - 400 K/uL   nRBC 0.0 0.0 - 0.2 %   Neutrophils Relative % 87 %   Neutro Abs 19.1 (H) 1.7 - 7.7 K/uL   Lymphocytes Relative 5 %   Lymphs Abs 1.0 0.7 - 4.0 K/uL   Monocytes Relative 7 %   Monocytes Absolute 1.6 (H) 0.1 - 1.0 K/uL   Eosinophils Relative 0 %   Eosinophils Absolute 0.1 0.0 - 0.5 K/uL   Basophils Relative 0 %   Basophils Absolute 0.1 0.0 - 0.1 K/uL   Immature Granulocytes 1 %   Abs Immature Granulocytes 0.12 (H) 0.00 - 0.07 K/uL  Comprehensive metabolic panel     Status: Abnormal   Collection Time: 06/21/18  7:02 PM  Result Value Ref Range   Sodium 137 135 - 145 mmol/L   Potassium 3.4 (L) 3.5 - 5.1 mmol/L   Chloride 103 98 - 111 mmol/L   CO2 21 (L) 22 - 32 mmol/L   Glucose, Bld 106 (H) 70 - 99 mg/dL   BUN 9 6 - 20 mg/dL   Creatinine, Ser 7.820.69 0.44 - 1.00 mg/dL   Calcium 9.2 8.9 - 95.610.3 mg/dL   Total Protein 8.8 (H) 6.5 - 8.1 g/dL    Albumin 4.5 3.5 - 5.0 g/dL   AST 18 15 - 41 U/L   ALT 13 0 - 44 U/L   Alkaline Phosphatase 91 38 - 126 U/L   Total Bilirubin 0.7 0.3 - 1.2 mg/dL   GFR calc non Af Amer >60 >60 mL/min   GFR calc Af Amer >60 >60 mL/min   Anion gap 13 5 - 15  Urinalysis, Complete w Microscopic     Status: Abnormal   Collection Time: 06/21/18  7:02 PM  Result Value Ref Range   Color, Urine YELLOW (A) YELLOW   APPearance HAZY (A) CLEAR   Specific Gravity, Urine 1.030 1.005 -  1.030   pH 5.0 5.0 - 8.0   Glucose, UA NEGATIVE NEGATIVE mg/dL   Hgb urine dipstick LARGE (A) NEGATIVE   Bilirubin Urine NEGATIVE NEGATIVE   Ketones, ur 80 (A) NEGATIVE mg/dL   Protein, ur 161 (A) NEGATIVE mg/dL   Nitrite NEGATIVE NEGATIVE   Leukocytes,Ua SMALL (A) NEGATIVE   RBC / HPF 21-50 0 - 5 RBC/hpf   WBC, UA 21-50 0 - 5 WBC/hpf   Bacteria, UA FEW (A) NONE SEEN   Squamous Epithelial / LPF 0-5 0 - 5   Mucus PRESENT   Group A Strep by PCR ([facility] Only)     Status: None   Collection Time: 06/21/18  7:02 PM  Result Value Ref Range   Group A Strep by PCR NOT DETECTED NOT DETECTED  Influenza panel by PCR (type A & B)     Status: None   Collection Time: 06/21/18  7:02 PM  Result Value Ref Range   Influenza A By PCR NEGATIVE NEGATIVE   Influenza B By PCR NEGATIVE NEGATIVE   ____________________________________________ ____________________________________________  RADIOLOGY  I personally reviewed all radiographic images ordered to evaluate for the above acute complaints and reviewed radiology reports and findings.  These findings were personally discussed with the patient.  Please see medical record for radiology report.  ____________________________________________   PROCEDURES  Procedure(s) performed:  Procedures    Critical Care performed: no ____________________________________________   INITIAL IMPRESSION / ASSESSMENT AND PLAN / ED COURSE  Pertinent labs & imaging results that were available during my care  of the patient were reviewed by me and considered in my medical decision making (see chart for details).   DDX: uti, pyelo, ectopic, uri, covid19, pna, appy  [name] is a 38 y.o. who presents to the ED with sx as described above.    Clinical Course as of Jun 20 2216  Thu Jun 21, 2018  2046 Patient with persistent fever tachycardia.  Numerous ketones on urinalysis.  Possible cystitis but unclear.  Also complaint of worsening headache.  Does not have any focal meningismus at this time but will send for CT imaging to further evaluate her complaints as well as order CT imaging of the head.   [PR]  2152 Patient does have CT findings consistent with pyelonephritis.  No evidence of appendicitis or abscess.  Will give Rocephin.  In light of these findings and lack of meningismus does not seem consistent with meningitis.    [PR]    Clinical Course User Index [PR] Willy Eddy, MD    The patient was evaluated in Emergency Department today for the symptoms described in the history of present illness. He/she was evaluated in the context of the global COVID-19 pandemic, which necessitated consideration that the patient might be at risk for infection with the SARS-CoV-2 virus that causes COVID-19. Institutional protocols and algorithms that pertain to the evaluation of patients at risk for COVID-19 are in a state of rapid change based on information released by regulatory bodies including the CDC and federal and state organizations. These policies and algorithms were followed during the patient's care in the ED.  As part of my medical decision making, I reviewed the following data within the electronic MEDICAL RECORD NUMBER Nursing notes reviewed and incorporated, Labs reviewed, notes from prior ED visits and [place] Controlled Substance Database   ____________________________________________   FINAL CLINICAL IMPRESSION(S) / ED DIAGNOSES  Final diagnoses:  Pyelonephritis  Sepsis without acute organ  dysfunction, due to unspecified organism (HCC)  NEW MEDICATIONS STARTED DURING THIS VISIT:  New Prescriptions   No medications on file     Note:  This document was prepared using Dragon voice recognition software and may include unintentional dictation errors.    Willy Eddy, MD 06/21/18 2218

## 2018-06-21 NOTE — H&P (Addendum)
[facility] [place] at [facility]   PATIENT NAME: [name]    MR#:  [id number]  DATE OF BIRTH:  01/11/1981  DATE OF ADMISSION:  06/21/2018  PRIMARY CARE PHYSICIAN: Patient, No Pcp Per   REQUESTING/REFERRING PHYSICIAN: Willy EddyPatrick Robinson, MD  CHIEF COMPLAINT:   Chief Complaint  Patient presents with  . Fever  . Generalized Body Aches    HISTORY OF PRESENT ILLNESS:  [name]  is a 38 y.o. female with a known history of hypertension who presented to the ED with subjective fevers, chills, and right-sided back pain started yesterday evening.  She states that her fevers and chills have continued throughout the day today.  She endorses urinary frequency and urgency.  She denies any dysuria or hematuria.  She denies any chest pain, shortness of breath, cough, abdominal pain.  She endorses some headache and mild neck pain.  No changes in vision.  In the ED, she was meeting sepsis criteria with temperature 101.4 F, tachycardia with heart rates in the low 100s, and leukocytosis to 22.  UA with large hemoglobin, 80 ketones, few bacteria, small leukocytes.  Chest x-ray negative.  CT abdomen pelvis with acute right-sided pyelonephritis.  She was started on ceftriaxone and hospitalist were called for admission.  PAST MEDICAL HISTORY:   Past Medical History:  Diagnosis Date  . Hypertension     PAST SURGICAL HISTORY:   Past Surgical History:  Procedure Laterality Date  . CESAREAN SECTION    . LAPAROSCOPIC TUBAL LIGATION Bilateral 01/26/2015   Procedure: LAPAROSCOPIC BILATERAL TUBAL LIGATION;  Surgeon: Tereso NewcomerUgonna A Anyanwu, MD;  Location: [facility];  Service: Gynecology;  Laterality: Bilateral;  . WISDOM TOOTH EXTRACTION      SOCIAL HISTORY:   Social History   Tobacco Use  . Smoking status: Current Every Day Smoker    Packs/day: 0.50    Years: 15.00    Pack years: 7.50    Types: Cigarettes  . Smokeless tobacco: Never Used  Substance Use Topics  . Alcohol use: Yes     Alcohol/week: 1.0 standard drinks    Types: 1 Glasses of wine per week    Comment: occasionally     FAMILY HISTORY:   Family History  Problem Relation Age of Onset  . Hypertension Mother   . Diabetes Mother   . Hyperlipidemia Mother   . Heart disease Mother     DRUG ALLERGIES:   Allergies  Allergen Reactions  . Hydrocodone Itching and Other (See Comments)    Headache   . Tramadol Itching    REVIEW OF SYSTEMS:   Review of Systems  Constitutional: Positive for chills and fever.  HENT: Negative for congestion and sore throat.   Eyes: Negative for blurred vision and double vision.  Respiratory: Negative for cough and shortness of breath.   Cardiovascular: Negative for chest pain and palpitations.  Gastrointestinal: Negative for nausea and vomiting.  Genitourinary: Positive for flank pain, frequency and urgency. Negative for dysuria and hematuria.  Musculoskeletal: Positive for back pain and neck pain.  Neurological: Negative for dizziness and headaches.  Psychiatric/Behavioral: Negative for depression. The patient is not nervous/anxious.     MEDICATIONS AT HOME:   Prior to Admission medications   Medication Sig Start Date End Date Taking? Authorizing Provider  acetaminophen-codeine 120-12 MG/5ML solution Take 10 mLs by mouth every 4 (four) hours as needed for moderate pain. Patient not taking: Reported on 06/21/2018 11/24/15   Charlestine NightLawyer, Christopher, PA-C  amoxicillin (AMOXIL) 500 MG capsule  Take 2 capsules (1,000 mg total) by mouth daily. Patient not taking: Reported on 11/24/2015 09/19/15   Ward, Chase Picket, PA-C  clindamycin (CLEOCIN) 150 MG capsule Take 3 capsules (450 mg total) by mouth 3 (three) times daily. Patient not taking: Reported on 06/21/2018 11/24/15   Charlestine Night, PA-C  HYDROcodone-acetaminophen (NORCO/VICODIN) 5-325 MG tablet Take 1-2 tablets by mouth every 6 hours as needed for pain and/or cough. Patient not taking: Reported on 06/21/2018 04/21/16    Pisciotta, Joni Reining, PA-C  ibuprofen (ADVIL,MOTRIN) 200 MG tablet Take 600 mg by mouth every 6 (six) hours as needed for fever, headache, mild pain, moderate pain or cramping.    [provider]  naproxen (NAPROSYN) 500 MG tablet Take 1 tablet (500 mg total) by mouth 2 (two) times daily. Patient not taking: Reported on 11/24/2015 09/19/15   Ward, Chase Picket, PA-C      VITAL SIGNS:  Blood pressure 128/88, pulse (!) 114, temperature (!) 101.4 F (38.6 C), temperature source Oral, resp. rate 20, height  (1.626 m), weight 64.4 kg, last menstrual period 06/11/2018, SpO2 100 %.  PHYSICAL EXAMINATION:  Physical Exam  GENERAL:  38 y.o.-year-old patient lying in the bed with no acute distress.  EYES: Pupils equal, round, reactive to light and accommodation. No scleral icterus. Extraocular muscles intact.  HEENT: Head atraumatic, normocephalic. Oropharynx and nasopharynx clear.  NECK:  Supple, no jugular venous distention. No thyroid enlargement, no tenderness.  LUNGS: Normal breath sounds bilaterally, no wheezing, rales,rhonchi or crepitation. No use of accessory muscles of respiration.  CARDIOVASCULAR: Tachycardic, regular rhythm S1, S2 normal. No murmurs, rubs, or gallops.  BACK: + Right CVA tenderness ABDOMEN: Soft, nontender, nondistended. Bowel sounds present. No organomegaly or mass.  EXTREMITIES: No pedal edema, cyanosis, or clubbing.  NEUROLOGIC: Cranial nerves II through XII are intact. Muscle strength 5/5 in all extremities. Sensation intact. Gait not checked.  PSYCHIATRIC: The patient is alert and oriented x 3.  SKIN: No obvious rash, lesion, or ulcer.   LABORATORY PANEL:   CBC Recent Labs  Lab 06/21/18 1902  WBC 22.0*  HGB 12.8  HCT 38.7  PLT 259   ------------------------------------------------------------------------------------------------------------------  Chemistries  Recent Labs  Lab 06/21/18 1902  NA 137  K 3.4*  CL 103  CO2 21*  GLUCOSE 106*   BUN 9  CREATININE 0.69  CALCIUM 9.2  AST 18  ALT 13  ALKPHOS 91  BILITOT 0.7   ------------------------------------------------------------------------------------------------------------------  Cardiac Enzymes No results for input(s): TROPONINI in the last 168 hours. ------------------------------------------------------------------------------------------------------------------  RADIOLOGY:  Ct Head Wo Contrast  Result Date: 06/21/2018 CLINICAL DATA:  Altered level of consciousness EXAM: CT HEAD WITHOUT CONTRAST TECHNIQUE: Contiguous axial images were obtained from the base of the skull through the vertex without intravenous contrast. COMPARISON:  12/31/2014 FINDINGS: Brain: No acute intracranial abnormality. Specifically, no hemorrhage, hydrocephalus, mass lesion, acute infarction, or significant intracranial injury. Vascular: No hyperdense vessel or unexpected calcification. Skull: No acute calvarial abnormality. Sinuses/Orbits: Visualized paranasal sinuses and mastoids clear. Orbital soft tissues unremarkable. Other: None IMPRESSION: Normal study. Electronically Signed   By: Charlett Nose M.D.   On: 06/21/2018 21:29   Ct Abdomen Pelvis W Contrast  Result Date: 06/21/2018 CLINICAL DATA:  Abdomen pain with fever EXAM: CT ABDOMEN AND PELVIS WITH CONTRAST TECHNIQUE: Multidetector CT imaging of the abdomen and pelvis was performed using the standard protocol following bolus administration of intravenous contrast. CONTRAST:  OMNIPAQUE IOHEXOL 300 MG/ML  SOLN COMPARISON:  CT 12/31/2014 FINDINGS: Lower chest: Lung bases  demonstrate no acute consolidation or pleural effusion. The heart size is normal. Hepatobiliary: No focal liver abnormality is seen. No gallstones, gallbladder wall thickening, or biliary dilatation. Pancreas: Unremarkable. No pancreatic ductal dilatation or surrounding inflammatory changes. Spleen: Normal in size without focal abnormality. Adrenals/Urinary Tract: Adrenal  glands are normal. 9 mm stone lower pole right kidney. Moderate perinephric fat stranding on the right. Mild urothelial enhancement of the right renal pelvis. Hypoenhancement lower pole right kidney. Subcentimeter hypodensities too small to further characterize. Bladder unremarkable Stomach/Bowel: Stomach is within normal limits. Appendix not well seen but no right lower quadrant inflammatory process. No evidence of bowel wall thickening, distention, or inflammatory changes. Vascular/Lymphatic: Moderate aortic atherosclerosis. No aneurysm. No significantly enlarged lymph nodes. Reproductive: Uterus and bilateral adnexa are unremarkable. 2.4 cm cyst in the right ovary. Other: Negative for free air or free fluid. Musculoskeletal: No acute or significant osseous findings. IMPRESSION: 1. Hypoenhancement lower pole right kidney with adjacent perinephric fat stranding. Mild urothelial enhancement of right renal pelvis. Collective findings are suspicious for acute pyelonephritis. 2. 9 mm stone in the lower pole of the right kidney Electronically Signed   By: Jasmine Pang M.D.   On: 06/21/2018 21:38   Dg Chest Portable 1 View  Result Date: 06/21/2018 CLINICAL DATA:  Fever, myalgia EXAM: PORTABLE CHEST 1 VIEW COMPARISON:  04/21/2016 FINDINGS: The heart size and mediastinal contours are within normal limits. Both lungs are clear. The visualized skeletal structures are unremarkable. IMPRESSION: No active disease. Electronically Signed   By: Jasmine Pang M.D.   On: 06/21/2018 18:54      IMPRESSION AND PLAN:   Sepsis secondary to acute right-sided pyelonephritis-patient meeting sepsis criteria on admission with fever, leukocytosis, and tachycardia. -Continue ceftriaxone -Follow-up blood and urine cultures -IV fluids  Sinus tachycardia-likely secondary to above -IV fluids -Check TSH  Hypokalemia -Replete and recheck  All the records are reviewed and case discussed with ED provider. Management plans  discussed with the patient, family and they are in agreement.  CODE STATUS: Full  TOTAL TIME TAKING CARE OF THIS PATIENT: 45 minutes.    Jinny Blossom Mayo M.D on 06/21/2018 at 11:11 PM  Between 7am to 6pm - Pager - [phone]  After 6pm go to [email or web address] - Social research officer, government  [facility]  Office  [phone]  CC: Primary care physician; Patient, No Pcp Per   Note: This dictation was prepared with Dragon dictation along with smaller phrase technology. Any transcriptional errors that result from this process are unintentional.

## 2018-06-21 NOTE — ED Notes (Signed)
Patient transported to CT 

## 2018-06-21 NOTE — ED Notes (Signed)
Pt was originally placed on COVID precautions due to fever. Upon assessment of pt and results of testing pt has been dx with pyelonephritis and Dr. Roxan Hockey is no longer concerned with COVID at this time. Test has been cancelled and pt has been taken off isolation restrictions.

## 2018-06-21 NOTE — ED Notes (Addendum)
Urine POC negative. 

## 2018-06-22 LAB — BASIC METABOLIC PANEL
Anion gap: 8 (ref 5–15)
BUN: 6 mg/dL (ref 6–20)
CO2: 17 mmol/L — ABNORMAL LOW (ref 22–32)
Calcium: 8 mg/dL — ABNORMAL LOW (ref 8.9–10.3)
Chloride: 111 mmol/L (ref 98–111)
Creatinine, Ser: 0.47 mg/dL (ref 0.44–1.00)
GFR calc Af Amer: >60 mL/min (ref >60)
GFR calc non Af Amer: >60 mL/min (ref >60)
Glucose, Bld: 109 mg/dL — ABNORMAL HIGH (ref 70–99)
Potassium: 3.2 mmol/L — ABNORMAL LOW (ref 3.5–5.1)
Sodium: 136 mmol/L (ref 135–145)

## 2018-06-22 LAB — LACTIC ACID, PLASMA
Lactic Acid, Venous: 0.7 mmol/L (ref 0.5–1.9)
Lactic Acid, Venous: 0.7 mmol/L (ref 0.5–1.9)

## 2018-06-22 LAB — CBC
HCT: 31.6 % — ABNORMAL LOW (ref 36.0–46.0)
Hemoglobin: 10.5 g/dL — ABNORMAL LOW (ref 12.0–15.0)
MCH: 29.5 pg (ref 26.0–34.0)
MCHC: 33.2 g/dL (ref 30.0–36.0)
MCV: 88.8 fL (ref 80.0–100.0)
Platelets: 197 10*3/uL (ref 150–400)
RBC: 3.56 MIL/uL — ABNORMAL LOW (ref 3.87–5.11)
RDW: 13.2 % (ref 11.5–15.5)
WBC: 16.5 10*3/uL — ABNORMAL HIGH (ref 4.0–10.5)
nRBC: 0 % (ref 0.0–0.2)

## 2018-06-22 LAB — TSH: TSH: 2.468 u[IU]/mL (ref 0.350–4.500)

## 2018-06-22 MED ORDER — ACETAMINOPHEN 325 MG PO TABS
650.0000 mg | ORAL_TABLET | Freq: Four times a day (QID) | ORAL | Status: DC | PRN
  Administered 2018-06-22 – 2018-06-23 (×4): 650 mg via ORAL
  Filled 2018-06-22 (×4): qty 2

## 2018-06-22 MED ORDER — POLYETHYLENE GLYCOL 3350 17 G PO PACK
17.0000 g | PACK | Freq: Every day | ORAL | Status: DC | PRN
  Filled 2018-06-22: qty 1

## 2018-06-22 MED ORDER — ONDANSETRON HCL 4 MG/2ML IJ SOLN
4.0000 mg | Freq: Four times a day (QID) | INTRAMUSCULAR | Status: DC | PRN
  Administered 2018-06-24: 08:00:00 4 mg via INTRAVENOUS
  Filled 2018-06-22: qty 2

## 2018-06-22 MED ORDER — NICOTINE 21 MG/24HR TD PT24
21.0000 mg | MEDICATED_PATCH | Freq: Every day | TRANSDERMAL | Status: DC
  Administered 2018-06-22 – 2018-06-24 (×2): 21 mg via TRANSDERMAL
  Filled 2018-06-22 (×7): qty 1

## 2018-06-22 MED ORDER — IBUPROFEN 400 MG PO TABS
400.0000 mg | ORAL_TABLET | ORAL | Status: DC | PRN
  Administered 2018-06-25: 08:00:00 400 mg via ORAL
  Filled 2018-06-22: qty 1

## 2018-06-22 MED ORDER — ONDANSETRON HCL 4 MG PO TABS
4.0000 mg | ORAL_TABLET | Freq: Four times a day (QID) | ORAL | Status: DC | PRN
  Administered 2018-06-23: 11:00:00 4 mg via ORAL
  Filled 2018-06-22: qty 1

## 2018-06-22 MED ORDER — SODIUM CHLORIDE 0.9 % IV SOLN
INTRAVENOUS | Status: DC
  Administered 2018-06-22 – 2018-06-24 (×8): via INTRAVENOUS

## 2018-06-22 MED ORDER — SODIUM CHLORIDE 0.9 % IV SOLN
1.0000 g | INTRAVENOUS | Status: DC
  Administered 2018-06-22 – 2018-06-23 (×2): 1 g via INTRAVENOUS
  Filled 2018-06-22: qty 10
  Filled 2018-06-22 (×2): qty 1

## 2018-06-22 MED ORDER — KETOROLAC TROMETHAMINE 30 MG/ML IJ SOLN
30.0000 mg | Freq: Four times a day (QID) | INTRAMUSCULAR | Status: DC | PRN
  Administered 2018-06-22 – 2018-06-25 (×10): 30 mg via INTRAVENOUS
  Filled 2018-06-22 (×11): qty 1

## 2018-06-22 MED ORDER — ACETAMINOPHEN 650 MG RE SUPP: 650.0000 mg | Freq: Four times a day (QID) | RECTAL | Status: DC | PRN

## 2018-06-22 MED ORDER — ENOXAPARIN SODIUM 40 MG/0.4ML ~~LOC~~ SOLN
40.0000 mg | SUBCUTANEOUS | Status: DC
  Administered 2018-06-22 (×2): 40 mg via SUBCUTANEOUS
  Filled 2018-06-22 (×2): qty 0.4

## 2018-06-22 MED ORDER — ACETAMINOPHEN 325 MG PO TABS
650.0000 mg | ORAL_TABLET | Freq: Once | ORAL | Status: AC
  Administered 2018-06-22: 05:00:00 650 mg via ORAL
  Filled 2018-06-22: qty 2

## 2018-06-22 MED ORDER — ACETAMINOPHEN 325 MG PO TABS
325.0000 mg | ORAL_TABLET | Freq: Once | ORAL | Status: DC
  Filled 2018-06-22: qty 1

## 2018-06-22 MED ORDER — POTASSIUM CHLORIDE CRYS ER 20 MEQ PO TBCR
40.0000 meq | EXTENDED_RELEASE_TABLET | Freq: Once | ORAL | Status: AC
  Administered 2018-06-22: 01:00:00 40 meq via ORAL
  Filled 2018-06-22: qty 2

## 2018-06-22 MED ORDER — MORPHINE SULFATE (PF) 2 MG/ML IV SOLN
2.0000 mg | INTRAVENOUS | Status: DC | PRN
  Administered 2018-06-22 – 2018-06-24 (×10): 2 mg via INTRAVENOUS
  Filled 2018-06-22 (×10): qty 1

## 2018-06-22 NOTE — Progress Notes (Signed)
Conferred with Ivar Drape, PhD regarding "duplicate therapy" notice on Toradol and Motrin; Pharmacist consulted with Dr. Arville Care to give 650mg  of Tylenol and give Motrin 30-60 mins later, if still running a fever.  Will continue to monitor. Windy Carina, RN 4:39 AM 06/22/2018

## 2018-06-22 NOTE — Progress Notes (Signed)
Dr. Arville Care notified of no lactic acid level; acknowledged; new order written. Windy Carina, RN; 5:14 AM 06/22/2018

## 2018-06-22 NOTE — Progress Notes (Signed)
[facility] - [place] at [facility]   PATIENT NAME: [name]    MR#:  [id number]  DATE OF BIRTH:  [birth year]  SUBJECTIVE:  CHIEF COMPLAINT: Patient is resting comfortably reporting right flank pain but denies any nausea vomiting tolerating diet  REVIEW OF SYSTEMS:  CONSTITUTIONAL: No fever, fatigue or weakness.  EYES: No blurred or double vision.  EARS, NOSE, AND THROAT: No tinnitus or ear pain.  RESPIRATORY: No cough, shortness of breath, wheezing or hemoptysis.  CARDIOVASCULAR: No chest pain, orthopnea, edema.  GASTROINTESTINAL: No nausea, vomiting, diarrhea or abdominal pain.  GENITOURINARY: No dysuria, hematuria.  ENDOCRINE: No polyuria, nocturia,  HEMATOLOGY: No anemia, easy bruising or bleeding SKIN: No rash or lesion. MUSCULOSKELETAL: No joint pain or arthritis.   NEUROLOGIC: No tingling, numbness, weakness.  PSYCHIATRY: No anxiety or depression.   DRUG ALLERGIES:   Allergies  Allergen Reactions  . Hydrocodone Itching and Other (See Comments)    Headache   . Tramadol Itching    VITALS:  Blood pressure 112/87, pulse 98, temperature 98.8 F (37.1 C), temperature source Oral, resp. rate 20, height 5\' 4"  (1.626 m), weight 64.4 kg, last menstrual period 06/11/2018, SpO2 100 %.  PHYSICAL EXAMINATION:  GENERAL:  38 y.o.-year-old patient lying in the bed with no acute distress.  EYES: Pupils equal, round, reactive to light and accommodation. No scleral icterus. Extraocular muscles intact.  HEENT: Head atraumatic, normocephalic. Oropharynx and nasopharynx clear.  NECK:  Supple, no jugular venous distention. No thyroid enlargement, no tenderness.  LUNGS: Normal breath sounds bilaterally, no wheezing, rales,rhonchi or crepitation. No use of accessory muscles of respiration.  CARDIOVASCULAR: S1, S2 normal. No murmurs, rubs, or gallops.  ABDOMEN: Soft, nontender, nondistended. Bowel sounds positive.  Right flank tenderness is present EXTREMITIES: No  pedal edema, cyanosis, or clubbing.  NEUROLOGIC: Awake alert and oriented x3 sensation intact. Gait not checked.  PSYCHIATRIC: The patient is alert and oriented x 3.  SKIN: No obvious rash, lesion, or ulcer.    LABORATORY PANEL:   CBC Recent Labs  Lab 06/22/18 0527  WBC 16.5*  HGB 10.5*  HCT 31.6*  PLT 197   ------------------------------------------------------------------------------------------------------------------  Chemistries  Recent Labs  Lab 06/21/18 1902 06/22/18 0527  NA 137 136  K 3.4* 3.2*  CL 103 111  CO2 21* 17*  GLUCOSE 106* 109*  BUN 9 6  CREATININE 0.69 0.47  CALCIUM 9.2 8.0*  AST 18  --   ALT 13  --   ALKPHOS 91  --   BILITOT 0.7  --    ------------------------------------------------------------------------------------------------------------------  Cardiac Enzymes No results for input(s): TROPONINI in the last 168 hours. ------------------------------------------------------------------------------------------------------------------  RADIOLOGY:  Ct Head Wo Contrast  Result Date: 06/21/2018 CLINICAL DATA:  Altered level of consciousness EXAM: CT HEAD WITHOUT CONTRAST TECHNIQUE: Contiguous axial images were obtained from the base of the skull through the vertex without intravenous contrast. COMPARISON:  12/31/2014 FINDINGS: Brain: No acute intracranial abnormality. Specifically, no hemorrhage, hydrocephalus, mass lesion, acute infarction, or significant intracranial injury. Vascular: No hyperdense vessel or unexpected calcification. Skull: No acute calvarial abnormality. Sinuses/Orbits: Visualized paranasal sinuses and mastoids clear. Orbital soft tissues unremarkable. Other: None IMPRESSION: Normal study. Electronically Signed   By: Charlett Nose M.D.   On: 06/21/2018 21:29   Ct Abdomen Pelvis W Contrast  Result Date: 06/21/2018 CLINICAL DATA:  Abdomen pain with fever EXAM: CT ABDOMEN AND PELVIS WITH CONTRAST TECHNIQUE: Multidetector CT imaging  of the abdomen and pelvis was performed using the standard protocol following  bolus administration of intravenous contrast. CONTRAST:  100mL OMNIPAQUE IOHEXOL 300 MG/ML  SOLN COMPARISON:  CT 12/31/2014 FINDINGS: Lower chest: Lung bases demonstrate no acute consolidation or pleural effusion. The heart size is normal. Hepatobiliary: No focal liver abnormality is seen. No gallstones, gallbladder wall thickening, or biliary dilatation. Pancreas: Unremarkable. No pancreatic ductal dilatation or surrounding inflammatory changes. Spleen: Normal in size without focal abnormality. Adrenals/Urinary Tract: Adrenal glands are normal. 9 mm stone lower pole right kidney. Moderate perinephric fat stranding on the right. Mild urothelial enhancement of the right renal pelvis. Hypoenhancement lower pole right kidney. Subcentimeter hypodensities too small to further characterize. Bladder unremarkable Stomach/Bowel: Stomach is within normal limits. Appendix not well seen but no right lower quadrant inflammatory process. No evidence of bowel wall thickening, distention, or inflammatory changes. Vascular/Lymphatic: Moderate aortic atherosclerosis. No aneurysm. No significantly enlarged lymph nodes. Reproductive: Uterus and bilateral adnexa are unremarkable. 2.4 cm cyst in the right ovary. Other: Negative for free air or free fluid. Musculoskeletal: No acute or significant osseous findings. IMPRESSION: 1. Hypoenhancement lower pole right kidney with adjacent perinephric fat stranding. Mild urothelial enhancement of right renal pelvis. Collective findings are suspicious for acute pyelonephritis. 2. 9 mm stone in the lower pole of the right kidney Electronically Signed   By: Jasmine PangKim  Fujinaga M.D.   On: 06/21/2018 21:38   Dg Chest Portable 1 View  Result Date: 06/21/2018 CLINICAL DATA:  Fever, myalgia EXAM: PORTABLE CHEST 1 VIEW COMPARISON:  04/21/2016 FINDINGS: The heart size and mediastinal contours are within normal limits. Both lungs  are clear. The visualized skeletal structures are unremarkable. IMPRESSION: No active disease. Electronically Signed   By: Jasmine PangKim  Fujinaga M.D.   On: 06/21/2018 18:54    EKG:   Orders placed or performed during the hospital encounter of 11/30/13  . EKG 12-Lead  . EKG 12-Lead    ASSESSMENT AND PLAN:     Sepsis secondary to acute right-sided pyelonephritis-patient meeting sepsis criteria on admission with fever, leukocytosis, and tachycardia. -Continue ceftriaxone -Follow-up blood and urine cultures -IV fluids -Leukocytosis is trending down  Sinus tachycardia-likely secondary to above -IV fluids -Normal TSH  Hypokalemia -Replete and recheck  Tobacco abuse disorder counseled patient to quit smoking for 5 minutes.  She verbalized understanding.  Agreeable to nicotine patch.  Admits smoking 1 pack a day   All the records are reviewed and case discussed with Care Management/Social Workerr. Management plans discussed with the patient, she is in agreement.  CODE STATUS: Full code TOTAL TIME TAKING CARE OF THIS PATIENT: 35minutes.   POSSIBLE D/C IN 1-2 DAYS, DEPENDING ON CLINICAL CONDITION.  Note: This dictation was prepared with Dragon dictation along with smaller phrase technology. Any transcriptional errors that result from this process are unintentional.   Ramonita LabAruna Cedricka Sackrider M.D on 06/22/2018 at 1:54 PM  Between 7am to 6pm - Pager - [phone] After 6pm go to [email or web address] - password [email or web address]  [facility] [place] [facility]  Office  [phone]  CC: Primary care physician; Patient, No Pcp Per

## 2018-06-22 NOTE — Progress Notes (Signed)
Dr. Arville Care notified of fever 103.1 orally; tylenol given; acknowledged; new orders written for additional dose of Tylenol 325mg  and Motrin 400 mg now and use cooling blanket if temperature does not come down. Windy Carina, RN 4:22 AM; 06/22/2018

## 2018-06-22 NOTE — ED Notes (Signed)
Pt ambulatory to bathroom independently

## 2018-06-22 NOTE — Progress Notes (Signed)
Dr. Arville Care paged to request Lactic Acid level; awaiting callback. Windy Carina, RN 4:58 AM 06/22/2018

## 2018-06-23 LAB — HIV ANTIBODY (ROUTINE TESTING W REFLEX): HIV Screen 4th Generation wRfx: NONREACTIVE

## 2018-06-23 LAB — BASIC METABOLIC PANEL
Anion gap: 8 (ref 5–15)
BUN: <5 mg/dL — ABNORMAL LOW (ref 6–20)
CO2: 18 mmol/L — ABNORMAL LOW (ref 22–32)
Calcium: 8.3 mg/dL — ABNORMAL LOW (ref 8.9–10.3)
Chloride: 111 mmol/L (ref 98–111)
Creatinine, Ser: 0.44 mg/dL (ref 0.44–1.00)
GFR calc Af Amer: >60 mL/min (ref >60)
GFR calc non Af Amer: >60 mL/min (ref >60)
Glucose, Bld: 89 mg/dL (ref 70–99)
Potassium: 3.3 mmol/L — ABNORMAL LOW (ref 3.5–5.1)
Sodium: 137 mmol/L (ref 135–145)

## 2018-06-23 LAB — CBC
HCT: 32.2 % — ABNORMAL LOW (ref 36.0–46.0)
Hemoglobin: 10.7 g/dL — ABNORMAL LOW (ref 12.0–15.0)
MCH: 29.2 pg (ref 26.0–34.0)
MCHC: 33.2 g/dL (ref 30.0–36.0)
MCV: 87.7 fL (ref 80.0–100.0)
Platelets: 203 10*3/uL (ref 150–400)
RBC: 3.67 MIL/uL — ABNORMAL LOW (ref 3.87–5.11)
RDW: 13.1 % (ref 11.5–15.5)
WBC: 14.2 10*3/uL — ABNORMAL HIGH (ref 4.0–10.5)
nRBC: 0 % (ref 0.0–0.2)

## 2018-06-23 MED ORDER — POTASSIUM CHLORIDE CRYS ER 20 MEQ PO TBCR
40.0000 meq | EXTENDED_RELEASE_TABLET | Freq: Once | ORAL | Status: AC
  Administered 2018-06-23: 11:00:00 40 meq via ORAL
  Filled 2018-06-23: qty 2

## 2018-06-23 MED ORDER — BUTALBITAL-APAP-CAFFEINE 50-325-40 MG PO TABS
1.0000 | ORAL_TABLET | ORAL | Status: DC | PRN
  Administered 2018-06-23 – 2018-06-24 (×5): 1 via ORAL
  Filled 2018-06-23 (×5): qty 1

## 2018-06-23 NOTE — Progress Notes (Signed)
[facility] [place] [facility]   PATIENT NAME: [name]    MR#:  [id number]  DATE OF BIRTH:  10/15/1980  SUBJECTIVE:  CHIEF COMPLAINT: Patient is resting comfortably reporting right-sided headache radiating from the right side of the neck denies any nausea, vomiting or neck stiffness also endorsing right flank pain but denies any nausea vomiting tolerating diet  REVIEW OF SYSTEMS:  CONSTITUTIONAL: No fever, fatigue or weakness.  EYES: No blurred or double vision.  EARS, NOSE, AND THROAT: No tinnitus or ear pain.  RESPIRATORY: No cough, shortness of breath, wheezing or hemoptysis.  CARDIOVASCULAR: No chest pain, orthopnea, edema.  GASTROINTESTINAL: No nausea, vomiting, diarrhea or abdominal pain.  GENITOURINARY: No dysuria, hematuria.  ENDOCRINE: No polyuria, nocturia,  HEMATOLOGY: No anemia, easy bruising or bleeding SKIN: No rash or lesion. MUSCULOSKELETAL: No joint pain or arthritis.   NEUROLOGIC: No tingling, numbness, weakness.  PSYCHIATRY: No anxiety or depression.   DRUG ALLERGIES:   Allergies  Allergen Reactions  . Hydrocodone Itching and Other (See Comments)    Headache   . Tramadol Itching    VITALS:  Blood pressure (!) 146/91, pulse 99, temperature 98.2 F (36.8 C), temperature source Oral, resp. rate 18, height 5\' 4"  (1.626 m), weight 64.4 kg, last menstrual period 06/11/2018, SpO2 100 %.  PHYSICAL EXAMINATION:  GENERAL:  38 y.o.-year-old patient lying in the bed with no acute distress.  EYES: Pupils equal, round, reactive to light and accommodation. No scleral icterus. Extraocular muscles intact.  HEENT: Head atraumatic, normocephalic. Oropharynx and nasopharynx clear.  NECK:  Supple, no jugular venous distention. No thyroid enlargement, no tenderness.  LUNGS: Normal breath sounds bilaterally, no wheezing, rales,rhonchi or crepitation. No use of accessory muscles of respiration.  CARDIOVASCULAR: S1, S2 normal. No murmurs, rubs,  or gallops.  ABDOMEN: Soft, nontender, nondistended. Bowel sounds positive.  Right flank tenderness is present EXTREMITIES: No pedal edema, cyanosis, or clubbing.  NEUROLOGIC: Awake alert and oriented x3 sensation intact. Gait not checked.  PSYCHIATRIC: The patient is alert and oriented x 3.  SKIN: No obvious rash, lesion, or ulcer.    LABORATORY PANEL:   CBC Recent Labs  Lab 06/23/18 0600  WBC 14.2*  HGB 10.7*  HCT 32.2*  PLT 203   ------------------------------------------------------------------------------------------------------------------  Chemistries  Recent Labs  Lab 06/21/18 1902  06/23/18 0600  NA 137   < > 137  K 3.4*   < > 3.3*  CL 103   < > 111  CO2 21*   < > 18*  GLUCOSE 106*   < > 89  BUN 9   < > <5*  CREATININE 0.69   < > 0.44  CALCIUM 9.2   < > 8.3*  AST 18  --   --   ALT 13  --   --   ALKPHOS 91  --   --   BILITOT 0.7  --   --    < > = values in this interval not displayed.   ------------------------------------------------------------------------------------------------------------------  Cardiac Enzymes No results for input(s): TROPONINI in the last 168 hours. ------------------------------------------------------------------------------------------------------------------  RADIOLOGY:  Ct Head Wo Contrast  Result Date: 06/21/2018 CLINICAL DATA:  Altered level of consciousness EXAM: CT HEAD WITHOUT CONTRAST TECHNIQUE: Contiguous axial images were obtained from the base of the skull through the vertex without intravenous contrast. COMPARISON:  12/31/2014 FINDINGS: Brain: No acute intracranial abnormality. Specifically, no hemorrhage, hydrocephalus, mass lesion, acute infarction, or significant intracranial injury. Vascular: No hyperdense vessel or unexpected calcification. Skull:  No acute calvarial abnormality. Sinuses/Orbits: Visualized paranasal sinuses and mastoids clear. Orbital soft tissues unremarkable. Other: None IMPRESSION: Normal study.  Electronically Signed   By: Charlett Nose M.D.   On: 06/21/2018 21:29   Ct Abdomen Pelvis W Contrast  Result Date: 06/21/2018 CLINICAL DATA:  Abdomen pain with fever EXAM: CT ABDOMEN AND PELVIS WITH CONTRAST TECHNIQUE: Multidetector CT imaging of the abdomen and pelvis was performed using the standard protocol following bolus administration of intravenous contrast. CONTRAST:  OMNIPAQUE IOHEXOL 300 MG/ML  SOLN COMPARISON:  CT 12/31/2014 FINDINGS: Lower chest: Lung bases demonstrate no acute consolidation or pleural effusion. The heart size is normal. Hepatobiliary: No focal liver abnormality is seen. No gallstones, gallbladder wall thickening, or biliary dilatation. Pancreas: Unremarkable. No pancreatic ductal dilatation or surrounding inflammatory changes. Spleen: Normal in size without focal abnormality. Adrenals/Urinary Tract: Adrenal glands are normal. 9 mm stone lower pole right kidney. Moderate perinephric fat stranding on the right. Mild urothelial enhancement of the right renal pelvis. Hypoenhancement lower pole right kidney. Subcentimeter hypodensities too small to further characterize. Bladder unremarkable Stomach/Bowel: Stomach is within normal limits. Appendix not well seen but no right lower quadrant inflammatory process. No evidence of bowel wall thickening, distention, or inflammatory changes. Vascular/Lymphatic: Moderate aortic atherosclerosis. No aneurysm. No significantly enlarged lymph nodes. Reproductive: Uterus and bilateral adnexa are unremarkable. 2.4 cm cyst in the right ovary. Other: Negative for free air or free fluid. Musculoskeletal: No acute or significant osseous findings. IMPRESSION: 1. Hypoenhancement lower pole right kidney with adjacent perinephric fat stranding. Mild urothelial enhancement of right renal pelvis. Collective findings are suspicious for acute pyelonephritis. 2. 9 mm stone in the lower pole of the right kidney Electronically Signed   By: Jasmine Pang M.D.    On: 06/21/2018 21:38   Dg Chest Portable 1 View  Result Date: 06/21/2018 CLINICAL DATA:  Fever, myalgia EXAM: PORTABLE CHEST 1 VIEW COMPARISON:  04/21/2016 FINDINGS: The heart size and mediastinal contours are within normal limits. Both lungs are clear. The visualized skeletal structures are unremarkable. IMPRESSION: No active disease. Electronically Signed   By: Jasmine Pang M.D.   On: 06/21/2018 18:54    EKG:   Orders placed or performed during the hospital encounter of 11/30/13  . EKG 12-Lead  . EKG 12-Lead    ASSESSMENT AND PLAN:     Sepsis secondary to acute right-sided pyelonephritis-patient meeting sepsis criteria on admission with fever, leukocytosis, and tachycardia. -Continue ceftriaxone -Follow-up blood cultures per admitting note but sample not collected.  Will get blood cultures if patient is febrile again -Urine culture greater than 100,000 colonies, sensitivity pending -IV fluids -Leukocytosis is trending down  Right-sided throbbing headache probably migraine Fioricet as needed if no improvement will consider magnesium and neurology consult  Sinus tachycardia-likely secondary to above -IV fluids -Normal TSH  Hypokalemia -Replete and recheck  Tobacco abuse disorder counseled patient to quit smoking for 5 minutes.  She verbalized understanding.  Agreeable to nicotine patch.  Admits smoking 1 pack a day   All the records are reviewed and case discussed with Care Management/Social Workerr. Management plans discussed with the patient, she is in agreement.  CODE STATUS: Full code TOTAL TIME TAKING CARE OF THIS PATIENT: .   POSSIBLE D/C IN 1-2 DAYS, DEPENDING ON CLINICAL CONDITION.  Note: This dictation was prepared with Dragon dictation along with smaller phrase technology. Any transcriptional errors that result from this process are unintentional.   Ramonita Lab M.D on 06/23/2018 at 2:34 PM  Between 7am  to 6pm - Pager - [phone] After 6pm go  to [email or web address] - password [email or web address]  [facility]  Office  [phone]  CC: Primary care physician; Patient, No Pcp Per

## 2018-06-23 NOTE — Progress Notes (Signed)
Pt ambulated in hallway   

## 2018-06-24 ENCOUNTER — Inpatient Hospital Stay: Payer: Medicaid Other

## 2018-06-24 DIAGNOSIS — N10 Acute tubulo-interstitial nephritis: Secondary | ICD-10-CM

## 2018-06-24 DIAGNOSIS — M542 Cervicalgia: Secondary | ICD-10-CM

## 2018-06-24 LAB — CBC WITH DIFFERENTIAL/PLATELET
Abs Immature Granulocytes: 0.04 10*3/uL (ref 0.00–0.07)
Basophils Absolute: 0 10*3/uL (ref 0.0–0.1)
Basophils Relative: 0 %
Eosinophils Absolute: 0.2 10*3/uL (ref 0.0–0.5)
Eosinophils Relative: 2 %
HCT: 32.3 % — ABNORMAL LOW (ref 36.0–46.0)
Hemoglobin: 10.8 g/dL — ABNORMAL LOW (ref 12.0–15.0)
Immature Granulocytes: 0 %
Lymphocytes Relative: 14 %
Lymphs Abs: 1.5 10*3/uL (ref 0.7–4.0)
MCH: 29 pg (ref 26.0–34.0)
MCHC: 33.4 g/dL (ref 30.0–36.0)
MCV: 86.8 fL (ref 80.0–100.0)
Monocytes Absolute: 1.2 10*3/uL — ABNORMAL HIGH (ref 0.1–1.0)
Monocytes Relative: 12 %
Neutro Abs: 7.5 10*3/uL (ref 1.7–7.7)
Neutrophils Relative %: 72 %
Platelets: 228 10*3/uL (ref 150–400)
RBC: 3.72 MIL/uL — ABNORMAL LOW (ref 3.87–5.11)
RDW: 12.9 % (ref 11.5–15.5)
Smear Review: NORMAL
WBC: 10.5 10*3/uL (ref 4.0–10.5)
nRBC: 0 % (ref 0.0–0.2)

## 2018-06-24 LAB — URINE CULTURE: Culture: >=100000 — AB

## 2018-06-24 MED ORDER — CYCLOBENZAPRINE HCL 10 MG PO TABS
5.0000 mg | ORAL_TABLET | Freq: Three times a day (TID) | ORAL | Status: DC | PRN
  Administered 2018-06-24 (×2): 5 mg via ORAL
  Filled 2018-06-24 (×3): qty 1

## 2018-06-24 MED ORDER — MAGNESIUM SULFATE 2 GM/50ML IV SOLN
2.0000 g | Freq: Once | INTRAVENOUS | Status: AC
  Administered 2018-06-24: 13:00:00 2 g via INTRAVENOUS
  Filled 2018-06-24: qty 50

## 2018-06-24 MED ORDER — GADOBUTROL 1 MMOL/ML IV SOLN
6.0000 mL | Freq: Once | INTRAVENOUS | Status: AC | PRN
  Administered 2018-06-24: 22:00:00 6 mL via INTRAVENOUS

## 2018-06-24 MED ORDER — MORPHINE SULFATE (PF) 2 MG/ML IV SOLN
2.0000 mg | Freq: Three times a day (TID) | INTRAVENOUS | Status: DC | PRN
  Administered 2018-06-24 – 2018-06-25 (×2): 2 mg via INTRAVENOUS
  Filled 2018-06-24 (×2): qty 1

## 2018-06-24 MED ORDER — CIPROFLOXACIN HCL 500 MG PO TABS
500.0000 mg | ORAL_TABLET | Freq: Two times a day (BID) | ORAL | Status: DC
  Administered 2018-06-24 – 2018-06-25 (×3): 500 mg via ORAL
  Filled 2018-06-24 (×3): qty 1

## 2018-06-24 MED ORDER — MORPHINE SULFATE (PF) 2 MG/ML IV SOLN
2.0000 mg | INTRAVENOUS | Status: DC | PRN
  Administered 2018-06-24 (×2): 2 mg via INTRAVENOUS
  Filled 2018-06-24 (×2): qty 1

## 2018-06-24 MED ORDER — HYDRALAZINE HCL 20 MG/ML IJ SOLN
10.0000 mg | Freq: Once | INTRAMUSCULAR | Status: AC
  Administered 2018-06-24: 10 mg via INTRAVENOUS
  Filled 2018-06-24: qty 1

## 2018-06-24 NOTE — Progress Notes (Signed)
[facility] [place] [facility]   PATIENT NAME: [name]    MR#:  [id number]  DATE OF BIRTH:  04/18/1980  SUBJECTIVE:  CHIEF COMPLAINT: Patient is in tears reporting severe headache though Fioricet is temporarily relieving not completely going away r, reporting right-sided headache radiating from the right side of the neck denies any nausea, vomiting or neck stiffness also endorsing right flank pain radiated to the left flank but denies any nausea vomiting tolerating diet  REVIEW OF SYSTEMS:  CONSTITUTIONAL: No fever, fatigue or weakness.  EYES: No blurred or double vision.  EARS, NOSE, AND THROAT: No tinnitus or ear pain.  RESPIRATORY: No cough, shortness of breath, wheezing or hemoptysis.  CARDIOVASCULAR: No chest pain, orthopnea, edema.  GASTROINTESTINAL: No nausea, vomiting, diarrhea or abdominal pain.  GENITOURINARY: No dysuria, hematuria.  ENDOCRINE: No polyuria, nocturia,  HEMATOLOGY: No anemia, easy bruising or bleeding SKIN: No rash or lesion. MUSCULOSKELETAL: No joint pain or arthritis.   NEUROLOGIC: No tingling, numbness, weakness.  PSYCHIATRY: No anxiety or depression.   DRUG ALLERGIES:   Allergies  Allergen Reactions  . Hydrocodone Itching and Other (See Comments)    Headache   . Tramadol Itching    VITALS:  Blood pressure (!) 136/109, pulse 92, temperature 98.7 F (37.1 C), temperature source Oral, resp. rate 19, height 5\' 4"  (1.626 m), weight 64.4 kg, last menstrual period 06/11/2018, SpO2 100 %.  PHYSICAL EXAMINATION:  GENERAL:  38 y.o.-year-old patient lying in the bed with no acute distress.  EYES: Pupils equal, round, reactive to light and accommodation. No scleral icterus. Extraocular muscles intact.  HEENT: Head atraumatic, normocephalic. Oropharynx and nasopharynx clear.  NECK:  Supple, no jugular venous distention. No thyroid enlargement, no tenderness.  No neck stiffness range of motion intact LUNGS: Normal breath  sounds bilaterally, no wheezing, rales,rhonchi or crepitation. No use of accessory muscles of respiration.  CARDIOVASCULAR: S1, S2 normal. No murmurs, rubs, or gallops.  ABDOMEN: Soft, nontender, nondistended. Bowel sounds positive.  Right flank tenderness is present EXTREMITIES: No pedal edema, cyanosis, or clubbing.  NEUROLOGIC: Awake alert and oriented x3 sensation intact. Gait not checked.  PSYCHIATRIC: The patient is alert and oriented x 3.  SKIN: No obvious rash, lesion, or ulcer.    LABORATORY PANEL:   CBC Recent Labs  Lab 06/24/18 0407  WBC 10.5  HGB 10.8*  HCT 32.3*  PLT 228   ------------------------------------------------------------------------------------------------------------------  Chemistries  Recent Labs  Lab 06/21/18 1902  06/23/18 0600  NA 137   < > 137  K 3.4*   < > 3.3*  CL 103   < > 111  CO2 21*   < > 18*  GLUCOSE 106*   < > 89  BUN 9   < > <5*  CREATININE 0.69   < > 0.44  CALCIUM 9.2   < > 8.3*  AST 18  --   --   ALT 13  --   --   ALKPHOS 91  --   --   BILITOT 0.7  --   --    < > = values in this interval not displayed.   ------------------------------------------------------------------------------------------------------------------  Cardiac Enzymes No results for input(s): TROPONINI in the last 168 hours. ------------------------------------------------------------------------------------------------------------------  RADIOLOGY:  No results found.  EKG:   Orders placed or performed during the hospital encounter of 11/30/13  . EKG 12-Lead  . EKG 12-Lead    ASSESSMENT AND PLAN:     Sepsis secondary to acute right-sided pyelonephritis-patient meeting  sepsis criteria on admission with fever, leukocytosis, and tachycardia. -Leukocytosis resolved and afebrile -Follow-up blood cultures per admitting note but sample not collected.  Will get blood cultures if patient is febrile again -Urine culture greater than 100,000 colonies, E.  coli sensitive to ciprofloxacin IV antibiotics changed to p.o. Cipro -IV fluids   -Back pain-acute K pad, pain medicines as needed, muscle relaxants  Right-sided throbbing headache probably migraine Fioricet as needed if no improvement will consider magnesium and neurology consult CT head is normal Seen by neurology Dr. Thad Ranger appreciate her recommendations.  Recommending Ultracet as needed for pain and if headache is not improving might consider MRI of the brain and cervical spine  Sinus tachycardia-likely secondary to above -IV fluids -Normal TSH  Hypokalemia -Replete and recheck  Tobacco abuse disorder counseled patient to quit smoking for 5 minutes.  She verbalized understanding.  Agreeable to nicotine patch.  Admits smoking 1 pack a day PT consult placed  All the records are reviewed and case discussed with Care Management/Social Workerr. Management plans discussed with the patient, she is in agreement.  CODE STATUS: Full code TOTAL TIME TAKING CARE OF THIS PATIENT: .   POSSIBLE D/C IN 1- DAYS, DEPENDING ON CLINICAL CONDITION.  Note: This dictation was prepared with Dragon dictation along with smaller phrase technology. Any transcriptional errors that result from this process are unintentional.   Ramonita Lab M.D on 06/24/2018 at 2:04 PM  Between 7am to 6pm - Pager - [phone] After 6pm go to [email or web address] - password [email or web address]  [facility]  Office  [phone]  CC: Primary care physician; Patient, No Pcp Per

## 2018-06-24 NOTE — Consult Note (Signed)
Reason for Consult:Headache Referring Physician: Gouru  CC: Radiating neck pain  HPI: [name] is an 38 y.o. female with a history of HTN who reports that since the day prior to admission she has had fever, body aches and neck pain.  The patient reports shooting pain that starts on her neck and radiates to her head.  It may occur multiple times per day and does not seem to be precipitated by any particular movement.  She has been having episodic radiating neck pain whenever she gets sick for years.  When she gets better the pain resolves.    Past Medical History:  Diagnosis Date  . Hypertension     Past Surgical History:  Procedure Laterality Date  . CESAREAN SECTION    . LAPAROSCOPIC TUBAL LIGATION Bilateral 01/26/2015   Procedure: LAPAROSCOPIC BILATERAL TUBAL LIGATION;  Surgeon: Tereso NewcomerUgonna A Anyanwu, MD;  Location: [facility];  Service: Gynecology;  Laterality: Bilateral;  . WISDOM TOOTH EXTRACTION      Family History  Problem Relation Age of Onset  . Hypertension Mother   . Diabetes Mother   . Hyperlipidemia Mother   . Heart disease Mother     Social History:  reports that she has been smoking cigarettes. She has a 7.50 pack-year smoking history. She has never used smokeless tobacco. She reports current alcohol use of about 1.0 standard drinks of alcohol per week. She reports current drug use. Drug: Marijuana.  Allergies  Allergen Reactions  . Hydrocodone Itching and Other (See Comments)    Headache   . Tramadol Itching    Medications:  I have reviewed the patient's current medications. Prior to Admission:  Medications Prior to Admission  Medication Sig Dispense Refill Last Dose  . acetaminophen-codeine 120-12 MG/5ML solution Take 10 mLs by mouth every 4 (four) hours as needed for moderate pain. (Patient not taking: Reported on 06/21/2018) 120 mL 0 Not Taking at Unknown time  . amoxicillin (AMOXIL) 500 MG capsule Take 2 capsules (1,000 mg total) by mouth daily. (Patient not  taking: Reported on 11/24/2015) 20 capsule 0 Not Taking at Unknown time  . clindamycin (CLEOCIN) 150 MG capsule Take 3 capsules (450 mg total) by mouth 3 (three) times daily. (Patient not taking: Reported on 06/21/2018) 90 capsule 0 Not Taking at Unknown time  . HYDROcodone-acetaminophen (NORCO/VICODIN) 5-325 MG tablet Take 1-2 tablets by mouth every 6 hours as needed for pain and/or cough. (Patient not taking: Reported on 06/21/2018) 11 tablet 0 Not Taking at Unknown time  . ibuprofen (ADVIL,MOTRIN) 200 MG tablet Take 600 mg by mouth every 6 (six) hours as needed for fever, headache, mild pain, moderate pain or cramping.   Not Taking at Unknown time  . naproxen (NAPROSYN) 500 MG tablet Take 1 tablet (500 mg total) by mouth 2 (two) times daily. (Patient not taking: Reported on 11/24/2015) 30 tablet 0 Not Taking at Unknown time   Scheduled: . ciprofloxacin  500 mg Oral BID  . enoxaparin (LOVENOX) injection  40 mg Subcutaneous Q24H  . nicotine  21 mg Transdermal Daily    ROS: History obtained from the patient  General ROS: as noted in HPI Psychological ROS: negative for - behavioral disorder, hallucinations, memory difficulties, mood swings or suicidal ideation Ophthalmic ROS: negative for - blurry vision, double vision, eye pain or loss of vision ENT ROS: negative for - epistaxis, nasal discharge, oral lesions, sore throat, tinnitus or vertigo Allergy and Immunology ROS: negative for - hives or itchy/watery eyes Hematological and Lymphatic ROS:  negative for - bleeding problems, bruising or swollen lymph nodes Endocrine ROS: negative for - galactorrhea, hair pattern changes, polydipsia/polyuria or temperature intolerance Respiratory ROS: negative for - cough, hemoptysis, shortness of breath or wheezing Cardiovascular ROS: negative for - chest pain, dyspnea on exertion, edema or irregular heartbeat Gastrointestinal ROS: negative for - abdominal pain, diarrhea, hematemesis, nausea/vomiting or stool  incontinence Genito-Urinary ROS: urinary frequency/urgency Musculoskeletal ROS: as noted in HPI Neurological ROS: as noted in HPI Dermatological ROS: negative for rash and skin lesion changes  Physical Examination: Blood pressure (!) 130/95, pulse 99, temperature 99.1 F (37.3 C), temperature source Oral, resp. rate 18, height  (1.626 m), weight 64.4 kg, last menstrual period 06/11/2018, SpO2 100 %.  HEENT-  Normocephalic, no lesions, without obvious abnormality.  Normal external eye and conjunctiva.  Normal TM's bilaterally.  Normal auditory canals and external ears. Normal external nose, mucus membranes and septum.  Normal pharynx. Cardiovascular- S1, S2 normal, pulses palpable throughout   Lungs- chest clear, no wheezing, rales, normal symmetric air entry Abdomen- soft, non-tender; bowel sounds normal; no masses,  no organomegaly Extremities- no edema Lymph-no adenopathy palpable Musculoskeletal-pain on palpation of the cervical muscluature Skin-warm and dry, no hyperpigmentation, vitiligo, or suspicious lesions  Neurological Examination   Mental Status: Alert, oriented, thought content appropriate.  Speech fluent without evidence of aphasia.  Able to follow 3 step commands without difficulty. Cranial Nerves: II: Discs flat bilaterally; Visual fields grossly normal, pupils equal, round, reactive to light and accommodation III,IV, VI: ptosis not present, extra-ocular motions intact bilaterally V,VII: smile symmetric, facial light touch sensation normal bilaterally VIII: hearing normal bilaterally IX,X: gag reflex present XI: bilateral shoulder shrug XII: midline tongue extension Motor: Right : Upper extremity   5/5    Left:     Upper extremity   5/5  Lower extremity   5/5     Lower extremity   5/5 Tone and bulk:normal tone throughout; no atrophy noted Sensory: Pinprick and light touch intact throughout, bilaterally Deep Tendon Reflexes: 3+ and symmetric  throughout Plantars: Right: downgoing   Left: downgoing Cerebellar: Normal finger-to-nose and normal heel-to-shin testing bilaterally Gait: not tested due to safety concerns   Laboratory Studies:   Basic Metabolic Panel: Recent Labs  Lab 06/21/18 1902 06/22/18 0527 06/23/18 0600  NA 137 136 137  K 3.4* 3.2* 3.3*  CL 103 111 111  CO2 21* 17* 18*  GLUCOSE 106* 109* 89  BUN 9 6 <5*  CREATININE 0.69 0.47 0.44  CALCIUM 9.2 8.0* 8.3*    Liver Function Tests: Recent Labs  Lab 06/21/18 1902  AST 18  ALT 13  ALKPHOS 91  BILITOT 0.7  PROT 8.8*  ALBUMIN 4.5   No results for input(s): LIPASE, AMYLASE in the last 168 hours. No results for input(s): AMMONIA in the last 168 hours.  CBC: Recent Labs  Lab 06/21/18 1902 06/22/18 0527 06/23/18 0600 06/24/18 0407  WBC 22.0* 16.5* 14.2* 10.5  NEUTROABS 19.1*  --   --  7.5  HGB 12.8 10.5* 10.7* 10.8*  HCT 38.7 31.6* 32.2* 32.3*  MCV 88.8 88.8 87.7 86.8  PLT 259 197 203 228    Cardiac Enzymes: No results for input(s): CKTOTAL, CKMB, CKMBINDEX, TROPONINI in the last 168 hours.  BNP: Invalid input(s): POCBNP  CBG: No results for input(s): GLUCAP in the last 168 hours.  Microbiology: Results for orders placed or performed during the hospital encounter of 06/21/18  Group A Strep by PCR ([facility] Only)  Status: None   Collection Time: 06/21/18  7:02 PM  Result Value Ref Range Status   Group A Strep by PCR NOT DETECTED NOT DETECTED Final    Comment: Performed at [facility], [address] [place], [place] [address]  Urine Culture     Status: Abnormal   Collection Time: 06/21/18  8:18 PM  Result Value Ref Range Status   Specimen Description   Final    URINE, RANDOM Performed at [facility], [address] [place], [place] [address]    Special Requests   Final    NONE Performed at [facility], [address] [place], [place] [address]    Culture >=100,000 COLONIES/mL ESCHERICHIA  COLI (A)  Final   Report Status 06/24/2018 FINAL  Final   Organism ID, Bacteria ESCHERICHIA COLI (A)  Final      Susceptibility   Escherichia coli - MIC*    AMPICILLIN >=32 RESISTANT Resistant     CEFAZOLIN 32 INTERMEDIATE Intermediate     CEFTRIAXONE <=1 SENSITIVE Sensitive     CIPROFLOXACIN <=0.25 SENSITIVE Sensitive     GENTAMICIN <=1 SENSITIVE Sensitive     IMIPENEM <=0.25 SENSITIVE Sensitive     NITROFURANTOIN 64 INTERMEDIATE Intermediate     TRIMETH/SULFA <=20 SENSITIVE Sensitive     AMPICILLIN/SULBACTAM >=32 RESISTANT Resistant     PIP/TAZO 64 INTERMEDIATE Intermediate     Extended ESBL NEGATIVE Sensitive     * >=100,000 COLONIES/mL ESCHERICHIA COLI  CULTURE, BLOOD (ROUTINE X 2) w Reflex to ID Panel     Status: None (Preliminary result)   Collection Time: 06/23/18  2:56 PM  Result Value Ref Range Status   Specimen Description BLOOD LEFT HAND  Final   Special Requests   Final    BOTTLES DRAWN AEROBIC AND ANAEROBIC Blood Culture adequate volume   Culture   Final    NO GROWTH < 24 HOURS Performed at [facility], [address], [place], [place] [address]    Report Status PENDING  Incomplete  CULTURE, BLOOD (ROUTINE X 2) w Reflex to ID Panel     Status: None (Preliminary result)   Collection Time: 06/23/18  3:03 PM  Result Value Ref Range Status   Specimen Description LEFT ANTECUBITAL  Final   Special Requests   Final    BOTTLES DRAWN AEROBIC AND ANAEROBIC Blood Culture adequate volume   Culture   Final    NO GROWTH < 24 HOURS Performed at [facility], [address] [place], [place] [address]    Report Status PENDING  Incomplete    Coagulation Studies: No results for input(s): LABPROT, INR in the last 72 hours.  Urinalysis:  Recent Labs  Lab 06/21/18 1902  COLORURINE YELLOW*  LABSPEC 1.030  PHURINE 5.0  GLUCOSEU NEGATIVE  HGBUR LARGE*  BILIRUBINUR NEGATIVE  KETONESUR 80*  PROTEINUR 100*  NITRITE NEGATIVE  LEUKOCYTESUR SMALL*     Lipid Panel:  No results found for: CHOL, TRIG, HDL, CHOLHDL, VLDL, LDLCALC  HgbA1C: No results found for: HGBA1C  Urine Drug Screen:      Component Value Date/Time   LABOPIA POSITIVE (A) 12/31/2014 0236   COCAINSCRNUR POSITIVE (A) 12/31/2014 0236   LABBENZ NONE DETECTED 12/31/2014 0236   AMPHETMU NONE DETECTED 12/31/2014 0236   THCU POSITIVE (A) 12/31/2014 0236   LABBARB NONE DETECTED 12/31/2014 0236    Alcohol Level: No results for input(s): ETH in the last 168 hours.  Imaging: No results found.   Assessment/Plan: 38 year  old female with a history of HTN and radiating neck pain with illness who was admitted for acute pyelonephritis.  Has developed radiating neck pain with this illness as well.  Reports that it is like her previous episodes.  Head CT performed on admission shows no acute changes.  Patient initially on Rocephin.  Currently on Cipro.     Recommendations: 1.  Would consider Ultracet prn for pain 2.  With history and normal neurological examination, no further imaging indicated at this time.  If symptoms progressive would consider MRI of the brain and cervical spine.    Thana Farr, MD Neurology [phone] 06/24/2018, 11:31 AM

## 2018-06-24 NOTE — Progress Notes (Signed)
Pt BP increasing throughout night. Primary nurse paged and spoke to Colman Cater NP. Orders received for Hydralazine 10 mg IV once. Primary nurse to continue to monitor.

## 2018-06-25 DIAGNOSIS — R51 Headache: Secondary | ICD-10-CM

## 2018-06-25 MED ORDER — AMLODIPINE BESYLATE 5 MG PO TABS: 5.0000 mg | ORAL_TABLET | Freq: Every day | ORAL | 1 refills | Status: DC

## 2018-06-25 MED ORDER — AMLODIPINE BESYLATE 5 MG PO TABS: 5.0000 mg | ORAL_TABLET | Freq: Every day | ORAL | Status: DC

## 2018-06-25 MED ORDER — CIPROFLOXACIN HCL 500 MG PO TABS: 500.0000 mg | ORAL_TABLET | Freq: Two times a day (BID) | ORAL | 0 refills | Status: DC

## 2018-06-25 NOTE — Progress Notes (Signed)
Nsg Discharge Note  Admit Date:  06/21/2018 Discharge date: 06/25/2018   [name] to be D/C'd Home per MD order.  AVS completed.  Copy for chart, and copy for patient signed, and dated. Patient/caregiver able to verbalize understanding.  Discharge Medication: Allergies as of 06/25/2018      Reactions   Hydrocodone Itching, Other (See Comments)   Headache    Tramadol Itching      Medication List    STOP taking these medications   acetaminophen-codeine 120-12 MG/5ML solution   amoxicillin 500 MG capsule Commonly known as:  AMOXIL   clindamycin 150 MG capsule Commonly known as:  CLEOCIN   HYDROcodone-acetaminophen 5-325 MG tablet Commonly known as:  NORCO/VICODIN   naproxen 500 MG tablet Commonly known as:  NAPROSYN     TAKE these medications   amLODipine 5 MG tablet Commonly known as:  NORVASC Take 1 tablet (5 mg total) by mouth daily.   ciprofloxacin 500 MG tablet Commonly known as:  CIPRO Take 1 tablet (500 mg total) by mouth 2 (two) times daily.   ibuprofen 200 MG tablet Commonly known as:  ADVIL Take 600 mg by mouth every 6 (six) hours as needed for fever, headache, mild pain, moderate pain or cramping.       Discharge Assessment: Vitals:   06/24/18 1917 06/25/18 0520  BP: (!) 129/98 (!) 150/105  Pulse: 97 89  Resp: 18 18  Temp: 99.5 F (37.5 C) 98.8 F (37.1 C)  SpO2: 100% 100%   Skin clean, dry and intact without evidence of skin break down, no evidence of skin tears noted. IV catheter discontinued intact. Site without signs and symptoms of complications - no redness or edema noted at insertion site, patient denies c/o pain - only slight tenderness at site.  Dressing with slight pressure applied.  D/c Instructions-Education: Discharge instructions given to patient/family with verbalized understanding. D/c education completed with patient/family including follow up instructions, medication list, d/c activities limitations if indicated, with other  d/c instructions as indicated by MD - patient able to verbalize understanding, all questions fully answered. Patient instructed to return to ED, call 911, or call MD for any changes in condition.  Patient escorted via WC, and D/C home via private auto.  Adair Laundry, RN 06/25/2018 10:16 AM

## 2018-06-25 NOTE — Progress Notes (Signed)
Physical Therapy Evaluation Patient Details Name: [name] MRN: [id number] DOB: 08/23/1980 Today's Date: 06/25/2018   History of Present Illness  [name]  is a 38 y.o. female with a known history of hypertension who presented to the ED with subjective fevers, chills, and right-sided back pain   Clinical Impression  Patient is admitted for above symptoms. She is independent with bed mobility and transfers from edge of bed. She is independent with ambulation without AD. She has no balance or strength deficits and does need skilled PT at this time. She will not be recommended any further PT .     Follow Up Recommendations No PT follow up    Equipment Recommendations       Recommendations for Other Services       Precautions / Restrictions Precautions Precautions: None Restrictions Weight Bearing Restrictions: No      Mobility  Bed Mobility Overal bed mobility: Independent                Transfers Overall transfer level: Independent                  Ambulation/Gait Ambulation/Gait assistance: Independent Gait Distance (Feet): 300 Feet Assistive device: IV Pole Gait Pattern/deviations: Step-through pattern        Stairs            Wheelchair Mobility    Modified Rankin (Stroke Patients Only)       Balance Overall balance assessment: Independent                                           Pertinent Vitals/Pain Pain Assessment: No/denies pain    Home Living Family/patient expects to be discharged to:: Private residence Living Arrangements: Parent;Children Available Help at Discharge: Family Type of Home: House Home Access: Stairs to enter   Secretary/administratorntrance Stairs-Number of Steps: 3 Home Layout: One level Home Equipment: None      Prior Function Level of Independence: Independent               Hand Dominance        Extremity/Trunk Assessment   Upper Extremity Assessment Upper Extremity Assessment:  Overall WFL for tasks assessed    Lower Extremity Assessment Lower Extremity Assessment: Overall WFL for tasks assessed       Communication   Communication: No difficulties  Cognition Arousal/Alertness: Awake/alert Behavior During Therapy: WFL for tasks assessed/performed Overall Cognitive Status: Within Functional Limits for tasks assessed                                        General Comments      Exercises     Assessment/Plan    PT Assessment Patent does not need any further PT services  PT Problem List         PT Treatment Interventions      PT Goals (Current goals can be found in the Care Plan section)  Acute Rehab PT Goals Patient Stated Goal: (to go home) PT Goal Formulation: With patient Time For Goal Achievement: 06/25/18 Potential to Achieve Goals: Good    Frequency     Barriers to discharge        Co-evaluation               AM-PAC PT "  6 Clicks" Mobility  Outcome Measure Help needed turning from your back to your side while in a flat bed without using bedrails?: None Help needed moving from lying on your back to sitting on the side of a flat bed without using bedrails?: None Help needed moving to and from a bed to a chair (including a wheelchair)?: None Help needed standing up from a chair using your arms (e.g., wheelchair or bedside chair)?: None Help needed to walk in hospital room?: None Help needed climbing 3-5 steps with a railing? : None 6 Click Score: 24    End of Session Equipment Utilized During Treatment: Gait belt Activity Tolerance: Patient tolerated treatment well Patient left: in bed   PT Visit Diagnosis: Muscle weakness (generalized) (M62.81)    Time: 7014-1030 PT Time Calculation (min) (ACUTE ONLY): 15 min   Charges:   PT Evaluation $PT Eval Low Complexity: 1 Low           [place], Sharion Settler, PT DPT 06/25/2018, 12:13 PM

## 2018-06-25 NOTE — Discharge Summary (Signed)
[facility] - [place] at [facility]   PATIENT NAME: [name]    MR#:  [id number]  DATE OF BIRTH:  08-13-1980  DATE OF ADMISSION:  06/21/2018 ADMITTING PHYSICIAN: Campbell Stall, MD  DATE OF DISCHARGE: 06/25/2018  PRIMARY CARE PHYSICIAN: Patient, No Pcp Per    ADMISSION DIAGNOSIS:  Pyelonephritis [N12] Sepsis without acute organ dysfunction, due to unspecified organism (HCC) [A41.9]  DISCHARGE DIAGNOSIS:  Acute Pyelonephritis Sepsis on admission HTN SECONDARY DIAGNOSIS:   Past Medical History:  Diagnosis Date  . Hypertension     HOSPITAL COURSE:   * Sepsis secondary to acute right-sided pyelonephritis-patient meeting sepsis criteria on admission with fever, leukocytosis, and tachycardia. -Leukocytosis resolved and afebrile -Follow-up blood cultures per admitting note but sample not collected.  Will get blood cultures if patient is febrile again -Urine culture greater than 100,000 colonies, E. coli sensitive to ciprofloxacin IV antibiotics changed to p.o. Cipro -received IV fluids  *HTN -not on any meds at home Will start low dose amlodipine  * Back pain-acute on chornic K pad, pain medicines as needed, muscle relaxants  *Right-sided throbbing headache probably migraine Fioricet as needed if no improvement will consider magnesium and neurology consult appreciated CT head is normal. MRI brain--few scattered hyperintensity--?complicated migraine -awaiting neuro input from dr Herma Carson -pt doing well with NSAIDS  *Tobacco abuse disorder counseled patient to quit smoking for 5 minutes.  She verbalized understanding.  Agreeable to nicotine patch.  Admits smoking 1 pack a day   D/c home today Pt agreeable Pt advised to get PCP in the area   CONSULTS OBTAINED:  Treatment Team:  Thana Farr, MD  DRUG ALLERGIES:   Allergies  Allergen Reactions  . Hydrocodone Itching and Other (See Comments)    Headache   . Tramadol Itching     DISCHARGE MEDICATIONS:   Allergies as of 06/25/2018      Reactions   Hydrocodone Itching, Other (See Comments)   Headache    Tramadol Itching      Medication List    STOP taking these medications   acetaminophen-codeine 120-12 MG/5ML solution   amoxicillin 500 MG capsule Commonly known as:  AMOXIL   clindamycin 150 MG capsule Commonly known as:  CLEOCIN   HYDROcodone-acetaminophen 5-325 MG tablet Commonly known as:  NORCO/VICODIN   naproxen 500 MG tablet Commonly known as:  NAPROSYN     TAKE these medications   amLODipine 5 MG tablet Commonly known as:  NORVASC Take 1 tablet (5 mg total) by mouth daily.   ciprofloxacin 500 MG tablet Commonly known as:  CIPRO Take 1 tablet (500 mg total) by mouth 2 (two) times daily.   ibuprofen 200 MG tablet Commonly known as:  ADVIL Take 600 mg by mouth every 6 (six) hours as needed for fever, headache, mild pain, moderate pain or cramping.       If you experience worsening of your admission symptoms, develop shortness of breath, life threatening emergency, suicidal or homicidal thoughts you must seek medical attention immediately by calling 911 or calling your MD immediately  if symptoms less severe.  You Must read complete instructions/literature along with all the possible adverse reactions/side effects for all the Medicines you take and that have been prescribed to you. Take any new Medicines after you have completely understood and accept all the possible adverse reactions/side effects.   Please note  You were cared for by a hospitalist during your hospital stay. If you have any questions about your discharge medications  or the care you received while you were in the hospital after you are discharged, you can call the unit and asked to speak with the hospitalist on call if the hospitalist that took care of you is not available. Once you are discharged, your primary care physician will handle any further medical issues.  Please note that NO REFILLS for any discharge medications will be authorized once you are discharged, as it is imperative that you return to your primary care physician (or establish a relationship with a primary care physician if you do not have one) for your aftercare needs so that they can reassess your need for medications and monitor your lab values. Today   SUBJECTIVE   Neck pain better. Took ibuprofen  VITAL SIGNS:  Blood pressure (!) 150/105, pulse 89, temperature 98.8 F (37.1 C), temperature source Oral, resp. rate 18, height 5\' 4"  (1.626 m), weight 64.4 kg, last menstrual period 06/11/2018, SpO2 100 %.  I/O:    Intake/Output Summary (Last 24 hours) at 06/25/2018 0859 Last data filed at 06/25/2018 [id number] Gross per 24 hour  Intake 4293.63 ml  Output 1100 ml  Net 3193.63 ml    PHYSICAL EXAMINATION:  GENERAL:  38 y.o.-year-old patient lying in the bed with no acute distress.  EYES: Pupils equal, round, reactive to light and accommodation. No scleral icterus. Extraocular muscles intact.  HEENT: Head atraumatic, normocephalic. Oropharynx and nasopharynx clear.  NECK:  Supple, no jugular venous distention. No thyroid enlargement, no tenderness.  LUNGS: Normal breath sounds bilaterally, no wheezing, rales,rhonchi or crepitation. No use of accessory muscles of respiration.  CARDIOVASCULAR: S1, S2 normal. No murmurs, rubs, or gallops.  ABDOMEN: Soft, non-tender, non-distended. Bowel sounds present. No organomegaly or mass.  EXTREMITIES: No pedal edema, cyanosis, or clubbing.  NEUROLOGIC: Cranial nerves II through XII are intact. Muscle strength 5/5 in all extremities. Sensation intact. Gait not checked.  PSYCHIATRIC: The patient is alert and oriented x 3.  SKIN: No obvious rash, lesion, or ulcer.   DATA REVIEW:   CBC  Recent Labs  Lab 06/24/18 0407  WBC 10.5  HGB 10.8*  HCT 32.3*  PLT 228    Chemistries  Recent Labs  Lab 06/21/18 1902  06/23/18 0600  NA 137   < > 137   K 3.4*   < > 3.3*  CL 103   < > 111  CO2 21*   < > 18*  GLUCOSE 106*   < > 89  BUN 9   < > <5*  CREATININE 0.69   < > 0.44  CALCIUM 9.2   < > 8.3*  AST 18  --   --   ALT 13  --   --   ALKPHOS 91  --   --   BILITOT 0.7  --   --    < > = values in this interval not displayed.    Microbiology Results   Recent Results (from the past 240 hour(s))  Group A Strep by PCR ([facility] Only)     Status: None   Collection Time: 06/21/18  7:02 PM  Result Value Ref Range Status   Group A Strep by PCR NOT DETECTED NOT DETECTED Final    Comment: Performed at [facility], [address] [place], [place] [id number]  Urine Culture     Status: Abnormal   Collection Time: 06/21/18  8:18 PM  Result Value Ref Range Status   Specimen Description   Final    URINE, RANDOM Performed at  [facility], [address] [place], [place] [address]    Special Requests   Final    NONE Performed at [facility], [address] [place], [place] [address]    Culture >=100,000 COLONIES/mL ESCHERICHIA COLI (A)  Final   Report Status 06/24/2018 FINAL  Final   Organism ID, Bacteria ESCHERICHIA COLI (A)  Final      Susceptibility   Escherichia coli - MIC*    AMPICILLIN >=32 RESISTANT Resistant     CEFAZOLIN 32 INTERMEDIATE Intermediate     CEFTRIAXONE <=1 SENSITIVE Sensitive     CIPROFLOXACIN <=0.25 SENSITIVE Sensitive     GENTAMICIN <=1 SENSITIVE Sensitive     IMIPENEM <=0.25 SENSITIVE Sensitive     NITROFURANTOIN 64 INTERMEDIATE Intermediate     TRIMETH/SULFA <=20 SENSITIVE Sensitive     AMPICILLIN/SULBACTAM >=32 RESISTANT Resistant     PIP/TAZO 64 INTERMEDIATE Intermediate     Extended ESBL NEGATIVE Sensitive     * >=100,000 COLONIES/mL ESCHERICHIA COLI  CULTURE, BLOOD (ROUTINE X 2) w Reflex to ID Panel     Status: None (Preliminary result)   Collection Time: 06/23/18  2:56 PM  Result Value Ref Range Status   Specimen Description BLOOD LEFT HAND  Final   Special Requests    Final    BOTTLES DRAWN AEROBIC AND ANAEROBIC Blood Culture adequate volume   Culture   Final    NO GROWTH < 24 HOURS Performed at [facility], [address] [place], [place] [address]    Report Status PENDING  Incomplete  CULTURE, BLOOD (ROUTINE X 2) w Reflex to ID Panel     Status: None (Preliminary result)   Collection Time: 06/23/18  3:03 PM  Result Value Ref Range Status   Specimen Description LEFT ANTECUBITAL  Final   Special Requests   Final    BOTTLES DRAWN AEROBIC AND ANAEROBIC Blood Culture adequate volume   Culture   Final    NO GROWTH < 24 HOURS Performed at [facility], [address] [place], [place] [address]    Report Status PENDING  Incomplete    RADIOLOGY:  Mr Laqueta Jean Wo Contrast  Result Date: 06/24/2018 CLINICAL DATA:  Initial evaluation for acute headache. EXAM: MRI HEAD WITHOUT AND WITH CONTRAST TECHNIQUE: Multiplanar, multiecho pulse sequences of the brain and surrounding structures were obtained without and with intravenous contrast. CONTRAST:  6 cc of Gadavist. COMPARISON:  Prior CT from 06/21/2018. FINDINGS: Brain: Cerebral volume within normal limits for patient age. Few scattered subcentimeter T2/FLAIR hyperintense foci noted within the subcortical and deep white matter both cerebral hemispheres, nonspecific, but mild for age. No abnormal foci of restricted diffusion to suggest acute or subacute ischemia. Gray-white matter differentiation well maintained. No encephalomalacia to suggest chronic infarction. No foci of susceptibility artifact to suggest acute or chronic intracranial hemorrhage. No mass lesion, midline shift or mass effect. No hydrocephalus. No extra-axial fluid collection. Major dural sinuses are grossly patent. Pituitary gland and suprasellar region are normal. Incidental note made of an empty sella. No abnormal enhancement. Vascular: Major intracranial vascular flow voids well maintained and normal in appearance. Skull and  upper cervical spine: Craniocervical junction normal. Visualized upper cervical spine within normal limits. Bone marrow signal intensity normal. No scalp soft tissue abnormality. Sinuses/Orbits: Globes and orbital soft tissues within normal limits. Paranasal sinuses are clear. No mastoid effusion. Inner ear structures normal. Other: None. IMPRESSION: 1. No acute intracranial abnormality. 2. Few scattered subcentimeter T2/FLAIR hyperintensities involving the subcortical white  matter both cerebral hemispheres, nonspecific. Differential considerations include sequelae of chronic small vessel ischemic disease, complicated migraine, vasculitis, or prior infectious/inflammatory process. Changes are not typical for underlying demyelinating disease. 3. Empty sella. While this finding is often incidental in nature and of no clinical significance, this can also be seen in the setting of idiopathic intracranial hypertension. Electronically Signed   By: Rise Mu M.D.   On: 06/24/2018 22:27     CODE STATUS:     Code Status Orders  (From admission, onward)         Start     Ordered   06/22/18 0036  Full code  Continuous     06/22/18 0035        Code Status History    This patient has a current code status but no historical code status.      TOTAL TIME TAKING CARE OF THIS PATIENT: *40* minutes.    Enedina Finner M.D on 06/25/2018 at 8:59 AM  Between 7am to 6pm - Pager - [phone] After 6pm go to [email or web address] - password [email or web address]  [facility]  Office  [phone]  CC: Primary care physician; Patient, No Pcp Per

## 2018-06-25 NOTE — Consult Note (Signed)
Subjective. No headache, but still complaint of back pain    Past Medical History:  Diagnosis Date  . Hypertension     Past Surgical History:  Procedure Laterality Date  . CESAREAN SECTION    . LAPAROSCOPIC TUBAL LIGATION Bilateral 01/26/2015   Procedure: LAPAROSCOPIC BILATERAL TUBAL LIGATION;  Surgeon: Tereso Newcomer, MD;  Location: [facility];  Service: Gynecology;  Laterality: Bilateral;  . WISDOM TOOTH EXTRACTION      Family History  Problem Relation Age of Onset  . Hypertension Mother   . Diabetes Mother   . Hyperlipidemia Mother   . Heart disease Mother     Social History:  reports that she has been smoking cigarettes. She has a 7.50 pack-year smoking history. She has never used smokeless tobacco. She reports current alcohol use of about 1.0 standard drinks of alcohol per week. She reports current drug use. Drug: Marijuana.  Allergies  Allergen Reactions  . Hydrocodone Itching and Other (See Comments)    Headache   . Tramadol Itching    Medications:  I have reviewed the patient's current medications. Prior to Admission:  Medications Prior to Admission  Medication Sig Dispense Refill Last Dose  . acetaminophen-codeine 120-12 MG/5ML solution Take 10 mLs by mouth every 4 (four) hours as needed for moderate pain. (Patient not taking: Reported on 06/21/2018) 120 mL 0 Not Taking at Unknown time  . amoxicillin (AMOXIL) 500 MG capsule Take 2 capsules (1,000 mg total) by mouth daily. (Patient not taking: Reported on 11/24/2015) 20 capsule 0 Not Taking at Unknown time  . clindamycin (CLEOCIN) 150 MG capsule Take 3 capsules (450 mg total) by mouth 3 (three) times daily. (Patient not taking: Reported on 06/21/2018) 90 capsule 0 Not Taking at Unknown time  . HYDROcodone-acetaminophen (NORCO/VICODIN) 5-325 MG tablet Take 1-2 tablets by mouth every 6 hours as needed for pain and/or cough. (Patient not taking: Reported on 06/21/2018) 11 tablet 0 Not Taking at Unknown time  . ibuprofen  (ADVIL,MOTRIN) 200 MG tablet Take 600 mg by mouth every 6 (six) hours as needed for fever, headache, mild pain, moderate pain or cramping.   Not Taking at Unknown time  . naproxen (NAPROSYN) 500 MG tablet Take 1 tablet (500 mg total) by mouth 2 (two) times daily. (Patient not taking: Reported on 11/24/2015) 30 tablet 0 Not Taking at Unknown time   Scheduled: . amLODipine  5 mg Oral Daily  . ciprofloxacin  500 mg Oral BID  . enoxaparin (LOVENOX) injection  40 mg Subcutaneous Q24H  . nicotine  21 mg Transdermal Daily    ROS: History obtained from the patient  General ROS: as noted in HPI Psychological ROS: negative for - behavioral disorder, hallucinations, memory difficulties, mood swings or suicidal ideation Ophthalmic ROS: negative for - blurry vision, double vision, eye pain or loss of vision ENT ROS: negative for - epistaxis, nasal discharge, oral lesions, sore throat, tinnitus or vertigo Allergy and Immunology ROS: negative for - hives or itchy/watery eyes Hematological and Lymphatic ROS: negative for - bleeding problems, bruising or swollen lymph nodes Endocrine ROS: negative for - galactorrhea, hair pattern changes, polydipsia/polyuria or temperature intolerance Respiratory ROS: negative for - cough, hemoptysis, shortness of breath or wheezing Cardiovascular ROS: negative for - chest pain, dyspnea on exertion, edema or irregular heartbeat Gastrointestinal ROS: negative for - abdominal pain, diarrhea, hematemesis, nausea/vomiting or stool incontinence Genito-Urinary ROS: urinary frequency/urgency Musculoskeletal ROS: as noted in HPI Neurological ROS: as noted in HPI Dermatological ROS: negative for rash and skin  lesion changes  Physical Examination: Blood pressure (!) 150/105, pulse 89, temperature 98.8 F (37.1 C), temperature source Oral, resp. rate 18, height  (1.626 m), weight 64.4 kg, last menstrual period 06/11/2018, SpO2 100 %.  HEENT-  Normocephalic, no lesions,  without obvious abnormality.  Normal external eye and conjunctiva.  Normal TM's bilaterally.  Normal auditory canals and external ears. Normal external nose, mucus membranes and septum.  Normal pharynx. Cardiovascular- S1, S2 normal, pulses palpable throughout   Lungs- chest clear, no wheezing, rales, normal symmetric air entry Abdomen- soft, non-tender; bowel sounds normal; no masses,  no organomegaly Extremities- no edema Lymph-no adenopathy palpable Musculoskeletal-pain on palpation of the cervical muscluature Skin-warm and dry, no hyperpigmentation, vitiligo, or suspicious lesions  Neurological Examination   Mental Status: Alert, oriented, thought content appropriate.  Speech fluent without evidence of aphasia.  Able to follow 3 step commands without difficulty. Cranial Nerves: II: Discs flat bilaterally; Visual fields grossly normal, pupils equal, round, reactive to light and accommodation III,IV, VI: ptosis not present, extra-ocular motions intact bilaterally V,VII: smile symmetric, facial light touch sensation normal bilaterally VIII: hearing normal bilaterally IX,X: gag reflex present XI: bilateral shoulder shrug XII: midline tongue extension Motor: Right : Upper extremity   5/5    Left:     Upper extremity   5/5  Lower extremity   5/5     Lower extremity   5/5 Tone and bulk:normal tone throughout; no atrophy noted Sensory: Pinprick and light touch intact throughout, bilaterally Deep Tendon Reflexes: 3+ and symmetric throughout Plantars: Right: downgoing   Left: downgoing Cerebellar: Normal finger-to-nose and normal heel-to-shin testing bilaterally Gait: not tested due to safety concerns   Laboratory Studies:   Basic Metabolic Panel: Recent Labs  Lab 06/21/18 1902 06/22/18 0527 06/23/18 0600  NA 137 136 137  K 3.4* 3.2* 3.3*  CL 103 111 111  CO2 21* 17* 18*  GLUCOSE 106* 109* 89  BUN 9 6 <5*  CREATININE 0.69 0.47 0.44  CALCIUM 9.2 8.0* 8.3*    Liver Function  Tests: Recent Labs  Lab 06/21/18 1902  AST 18  ALT 13  ALKPHOS 91  BILITOT 0.7  PROT 8.8*  ALBUMIN 4.5   No results for input(s): LIPASE, AMYLASE in the last 168 hours. No results for input(s): AMMONIA in the last 168 hours.  CBC: Recent Labs  Lab 06/21/18 1902 06/22/18 0527 06/23/18 0600 06/24/18 0407  WBC 22.0* 16.5* 14.2* 10.5  NEUTROABS 19.1*  --   --  7.5  HGB 12.8 10.5* 10.7* 10.8*  HCT 38.7 31.6* 32.2* 32.3*  MCV 88.8 88.8 87.7 86.8  PLT 259 197 203 228    Cardiac Enzymes: No results for input(s): CKTOTAL, CKMB, CKMBINDEX, TROPONINI in the last 168 hours.  BNP: Invalid input(s): POCBNP  CBG: No results for input(s): GLUCAP in the last 168 hours.  Microbiology: Results for orders placed or performed during the hospital encounter of 06/21/18  Group A Strep by PCR ([facility] Only)     Status: None   Collection Time: 06/21/18  7:02 PM  Result Value Ref Range Status   Group A Strep by PCR NOT DETECTED NOT DETECTED Final    Comment: Performed at [facility], [address] [place], [place] [address]  Urine Culture     Status: Abnormal   Collection Time: 06/21/18  8:18 PM  Result Value Ref Range Status   Specimen Description   Final    URINE, RANDOM Performed at [facility], [address] [place], [place] [address]    Special Requests   Final    NONE Performed at [facility], [address] [place], [place] [address]    Culture >=100,000 COLONIES/mL ESCHERICHIA COLI (A)  Final   Report Status 06/24/2018 FINAL  Final   Organism ID, Bacteria ESCHERICHIA COLI (A)  Final      Susceptibility   Escherichia coli - MIC*    AMPICILLIN >=32 RESISTANT Resistant     CEFAZOLIN 32 INTERMEDIATE Intermediate     CEFTRIAXONE <=1 SENSITIVE Sensitive     CIPROFLOXACIN <=0.25 SENSITIVE Sensitive     GENTAMICIN <=1 SENSITIVE Sensitive     IMIPENEM <=0.25 SENSITIVE Sensitive     NITROFURANTOIN 64 INTERMEDIATE Intermediate     TRIMETH/SULFA  <=20 SENSITIVE Sensitive     AMPICILLIN/SULBACTAM >=32 RESISTANT Resistant     PIP/TAZO 64 INTERMEDIATE Intermediate     Extended ESBL NEGATIVE Sensitive     * >=100,000 COLONIES/mL ESCHERICHIA COLI  CULTURE, BLOOD (ROUTINE X 2) w Reflex to ID Panel     Status: None (Preliminary result)   Collection Time: 06/23/18  2:56 PM  Result Value Ref Range Status   Specimen Description BLOOD LEFT HAND  Final   Special Requests   Final    BOTTLES DRAWN AEROBIC AND ANAEROBIC Blood Culture adequate volume   Culture   Final    NO GROWTH 2 DAYS Performed at [facility], [address], [place], [place] [address]    Report Status PENDING  Incomplete  CULTURE, BLOOD (ROUTINE X 2) w Reflex to ID Panel     Status: None (Preliminary result)   Collection Time: 06/23/18  3:03 PM  Result Value Ref Range Status   Specimen Description LEFT ANTECUBITAL  Final   Special Requests   Final    BOTTLES DRAWN AEROBIC AND ANAEROBIC Blood Culture adequate volume   Culture   Final    NO GROWTH 2 DAYS Performed at [facility], [address] [place], [place] [address]    Report Status PENDING  Incomplete    Coagulation Studies: No results for input(s): LABPROT, INR in the last 72 hours.  Urinalysis:  Recent Labs  Lab 06/21/18 1902  COLORURINE YELLOW*  LABSPEC 1.030  PHURINE 5.0  GLUCOSEU NEGATIVE  HGBUR LARGE*  BILIRUBINUR NEGATIVE  KETONESUR 80*  PROTEINUR 100*  NITRITE NEGATIVE  LEUKOCYTESUR SMALL*    Lipid Panel:  No results found for: CHOL, TRIG, HDL, CHOLHDL, VLDL, LDLCALC  HgbA1C: No results found for: HGBA1C  Urine Drug Screen:      Component Value Date/Time   LABOPIA POSITIVE (A) 12/31/2014 0236   COCAINSCRNUR POSITIVE (A) 12/31/2014 0236   LABBENZ NONE DETECTED 12/31/2014 0236   AMPHETMU NONE DETECTED 12/31/2014 0236   THCU POSITIVE (A) 12/31/2014 0236   LABBARB NONE DETECTED 12/31/2014 0236    Alcohol Level: No results for input(s): ETH in the last 168  hours.  Imaging: Mr Laqueta Jean QM Contrast  Result Date: 06/24/2018 CLINICAL DATA:  Initial evaluation for acute headache. EXAM: MRI HEAD WITHOUT AND WITH CONTRAST TECHNIQUE: Multiplanar, multiecho pulse sequences of the brain and surrounding structures were obtained without and with intravenous contrast. CONTRAST:  6 cc of Gadavist. COMPARISON:  Prior CT from 06/21/2018. FINDINGS: Brain: Cerebral volume within normal limits for patient age. Few scattered subcentimeter T2/FLAIR hyperintense foci noted within the subcortical and deep white matter both cerebral hemispheres, nonspecific, but mild for age. No abnormal foci of restricted diffusion to suggest acute  or subacute ischemia. Gray-white matter differentiation well maintained. No encephalomalacia to suggest chronic infarction. No foci of susceptibility artifact to suggest acute or chronic intracranial hemorrhage. No mass lesion, midline shift or mass effect. No hydrocephalus. No extra-axial fluid collection. Major dural sinuses are grossly patent. Pituitary gland and suprasellar region are normal. Incidental note made of an empty sella. No abnormal enhancement. Vascular: Major intracranial vascular flow voids well maintained and normal in appearance. Skull and upper cervical spine: Craniocervical junction normal. Visualized upper cervical spine within normal limits. Bone marrow signal intensity normal. No scalp soft tissue abnormality. Sinuses/Orbits: Globes and orbital soft tissues within normal limits. Paranasal sinuses are clear. No mastoid effusion. Inner ear structures normal. Other: None. IMPRESSION: 1. No acute intracranial abnormality. 2. Few scattered subcentimeter T2/FLAIR hyperintensities involving the subcortical white matter both cerebral hemispheres, nonspecific. Differential considerations include sequelae of chronic small vessel ischemic disease, complicated migraine, vasculitis, or prior infectious/inflammatory process. Changes are not typical  for underlying demyelinating disease. 3. Empty sella. While this finding is often incidental in nature and of no clinical significance, this can also be seen in the setting of idiopathic intracranial hypertension. Electronically Signed   By: Rise MuBenjamin  McClintock M.D.   On: 06/24/2018 22:27     Assessment/Plan: 38 year old female with a history of HTN and radiating neck pain with illness who was admitted for acute pyelonephritis.  Has developed radiating neck pain with this illness as well.  Reports that it is like her previous episodes.  Head CT performed on admission shows no acute changes.    MRI brian reviewed with some white matter changes which are non specific and not acute. Could be from her headaches/head pain, etc...  No acute intervention. Can be d/c from neuro stand point  06/25/2018, 11:15 AM

## 2018-06-25 NOTE — Discharge Instructions (Signed)
° °  Pyelonephritis, Adult  Pyelonephritis is a kidney infection. The kidneys are organs that help clean your blood by moving waste out of your blood and into your pee (urine). This infection can happen quickly, or it can last for a long time. In most cases, it clears up with treatment and does not cause other problems. Follow these instructions at home: Medicines  Take over-the-counter and prescription medicines only as told by your doctor.  Take your antibiotic medicine as told by your doctor. Do not stop taking the medicine even if you start to feel better. General instructions  Drink enough fluid to keep your pee clear or pale yellow.  Avoid caffeine, tea, and carbonated drinks.  Pee (urinate) often. Avoid holding in pee for long periods of time.  Pee before and after sex.  After pooping (having a bowel movement), women should wipe from front to back. Use each tissue only once.  Keep all follow-up visits as told by your doctor. This is important. Contact a doctor if:  You do not feel better after 2 days.  Your symptoms get worse.  You have a fever. Get help right away if:  You cannot take your medicine or drink fluids as told.  You have chills and shaking.  You throw up (vomit).  You have very bad pain in your side (flank) or back.  You feel very weak or you pass out (faint). This information is not intended to replace advice given to you by your health care provider. Make sure you discuss any questions you have with your health care provider. Document Released: 03/24/2004 Document Revised: 07/23/2015 Document Reviewed: 06/09/2014 Elsevier Interactive Patient Education  2019 Elsevier Inc. Pt advised to keep log of BP at home Establish PCP in the area

## 2018-06-28 LAB — CULTURE, BLOOD (ROUTINE X 2)
Culture: NO GROWTH
Culture: NO GROWTH
Special Requests: ADEQUATE
Special Requests: ADEQUATE

## 2018-10-04 DIAGNOSIS — Z79899 Other long term (current) drug therapy: Secondary | ICD-10-CM | POA: Insufficient documentation

## 2018-10-04 DIAGNOSIS — S61214A Laceration without foreign body of right ring finger without damage to nail, initial encounter: Secondary | ICD-10-CM | POA: Diagnosis present

## 2018-10-04 DIAGNOSIS — Z23 Encounter for immunization: Secondary | ICD-10-CM | POA: Diagnosis not present

## 2018-10-04 DIAGNOSIS — Y990 Civilian activity done for income or pay: Secondary | ICD-10-CM | POA: Diagnosis not present

## 2018-10-04 DIAGNOSIS — Y939 Activity, unspecified: Secondary | ICD-10-CM | POA: Diagnosis not present

## 2018-10-04 DIAGNOSIS — Y92511 Restaurant or cafe as the place of occurrence of the external cause: Secondary | ICD-10-CM | POA: Insufficient documentation

## 2018-10-04 DIAGNOSIS — I10 Essential (primary) hypertension: Secondary | ICD-10-CM | POA: Diagnosis not present

## 2018-10-04 DIAGNOSIS — W268XXA Contact with other sharp object(s), not elsewhere classified, initial encounter: Secondary | ICD-10-CM | POA: Diagnosis not present

## 2018-10-04 DIAGNOSIS — F17210 Nicotine dependence, cigarettes, uncomplicated: Secondary | ICD-10-CM | POA: Insufficient documentation

## 2018-10-04 NOTE — ED Triage Notes (Signed)
Patient c/o laceration ring finger right hand at work on metal crate.

## 2018-10-05 ENCOUNTER — Emergency Department
Admission: EM | Admit: 2018-10-05 | Discharge: 2018-10-05 | Disposition: A | Discharge status: Home / Self Care | Payer: No Typology Code available for payment source | Attending: Emergency Medicine | Admitting: Emergency Medicine

## 2018-10-05 ENCOUNTER — Other Ambulatory Visit: Payer: Self-pay

## 2018-10-05 DIAGNOSIS — S61214A Laceration without foreign body of right ring finger without damage to nail, initial encounter: Secondary | ICD-10-CM

## 2018-10-05 MED ORDER — IBUPROFEN 600 MG PO TABS
600.0000 mg | ORAL_TABLET | Freq: Once | ORAL | Status: AC
  Administered 2018-10-05: 600 mg via ORAL
  Filled 2018-10-05: qty 1

## 2018-10-05 MED ORDER — TETANUS-DIPHTH-ACELL PERTUSSIS 5-2.5-18.5 LF-MCG/0.5 IM SUSP
0.5000 mL | Freq: Once | INTRAMUSCULAR | Status: AC
  Administered 2018-10-05: 0.5 mL via INTRAMUSCULAR
  Filled 2018-10-05: qty 0.5

## 2018-10-05 NOTE — ED Provider Notes (Signed)
[facility] Emergency Department Provider Note   ____________________________________________   First MD Initiated Contact with Patient 10/05/18 0050     (approximate)  I have reviewed the triage vital signs and the nursing notes.   HISTORY  Chief Complaint Laceration    HPI [name] is a 38 y.o. female who presents to the ED from work with a chief complaint of right finger laceration.  Patient was at work at [employer] when she cut her right fourth digit on a metal crate.  Presents with pain to her right fourth finger.  Voices no other complaints of pain or injury.  Tetanus is not up-to-date.       Past Medical History:  Diagnosis Date  . Hypertension     Patient Active Problem List   Diagnosis Date Noted  . Acute pyelonephritis 06/21/2018    Past Surgical History:  Procedure Laterality Date  . CESAREAN SECTION    . LAPAROSCOPIC TUBAL LIGATION Bilateral 01/26/2015   Procedure: LAPAROSCOPIC BILATERAL TUBAL LIGATION;  Surgeon: Osborne Oman, MD;  Location: [facility];  Service: Gynecology;  Laterality: Bilateral;  . WISDOM TOOTH EXTRACTION      Prior to Admission medications   Medication Sig Start Date End Date Taking? Authorizing Provider  amLODipine (NORVASC) 5 MG tablet Take 1 tablet (5 mg total) by mouth daily. 06/25/18   Fritzi Mandes, MD  ciprofloxacin (CIPRO) 500 MG tablet Take 1 tablet (500 mg total) by mouth 2 (two) times daily. 06/25/18   Fritzi Mandes, MD  ibuprofen (ADVIL,MOTRIN) 200 MG tablet Take 600 mg by mouth every 6 (six) hours as needed for fever, headache, mild pain, moderate pain or cramping.    [provider]    Allergies Hydrocodone and Tramadol  Family History  Problem Relation Age of Onset  . Hypertension Mother   . Diabetes Mother   . Hyperlipidemia Mother   . Heart disease Mother     Social History Social History   Tobacco Use  . Smoking status: Current Every Day Smoker    Packs/day: 0.50   Years: 15.00    Pack years: 7.50    Types: Cigarettes  . Smokeless tobacco: Never Used  Substance Use Topics  . Alcohol use: Yes    Alcohol/week: 1.0 standard drinks    Types: 1 Glasses of wine per week    Comment: occasionally   . Drug use: Yes    Types: Marijuana    Review of Systems  Constitutional: No fever/chills Eyes: No visual changes. ENT: No sore throat. Cardiovascular: Denies chest pain. Respiratory: Denies shortness of breath. Gastrointestinal: No abdominal pain.  No nausea, no vomiting.  No diarrhea.  No constipation. Genitourinary: Negative for dysuria. Musculoskeletal: Positive for right finger laceration.  Negative for back pain. Skin: Negative for rash. Neurological: Negative for headaches, focal weakness or numbness.   ____________________________________________   PHYSICAL EXAM:  VITAL SIGNS: ED Triage Vitals [10/04/18 2359]  Enc Vitals Group     BP (!) 161/100     Pulse Rate 80     Resp 17     Temp 99.7 F (37.6 C)     Temp Source Oral     SpO2 100 %     Weight 145 lb (65.8 kg)     Height 5\' 4"  (1.626 m)     Head Circumference      Peak Flow      Pain Score 8     Pain Loc  Pain Edu?      Excl. in GC?     Constitutional: Alert and oriented. Well appearing and in mild acute distress.  Tearful. Eyes: Conjunctivae are normal. PERRL. EOMI. Head: Atraumatic. Nose: No congestion/rhinnorhea. Mouth/Throat: Mucous membranes are moist.  Oropharynx non-erythematous. Neck: No stridor.   Cardiovascular: Normal rate, regular rhythm. Grossly normal heart sounds.  Good peripheral circulation. Respiratory: Normal respiratory effort.  No retractions. Lungs CTAB. Gastrointestinal: Soft and nontender. No distention. No abdominal bruits. No CVA tenderness. Musculoskeletal:  Right fourth digit: Approximately 0.75 cm curved laceration to lateral finger pad adjacent to the nail.  No nailbed injury.  No active bleeding.  Well approximated. Neurologic:   Normal speech and language. No gross focal neurologic deficits are appreciated. No gait instability. Skin:  Skin is warm, dry and intact. No rash noted. Psychiatric: Mood and affect are normal. Speech and behavior are normal.  ____________________________________________   LABS (all labs ordered are listed, but only abnormal results are displayed)  Labs Reviewed - No data to display ____________________________________________  EKG  None ____________________________________________  RADIOLOGY  ED MD interpretation: None  Official radiology report(s): No results found.  ____________________________________________   PROCEDURES  Procedure(s) performed (including Critical Care):  Procedures   ____________________________________________   INITIAL IMPRESSION / ASSESSMENT AND PLAN / ED COURSE  As part of my medical decision making, I reviewed the following data within the electronic MEDICAL RECORD NUMBER Nursing notes reviewed and incorporated and Notes from prior ED visits     [name] was evaluated in Emergency Department on 10/05/2018 for the symptoms described in the history of present illness. She was evaluated in the context of the global COVID-19 pandemic, which necessitated consideration that the patient might be at risk for infection with the SARS-CoV-2 virus that causes COVID-19. Institutional protocols and algorithms that pertain to the evaluation of patients at risk for COVID-19 are in a state of rapid change based on information released by regulatory bodies including the CDC and federal and state organizations. These policies and algorithms were followed during the patient's care in the ED.   38 year old female who presents with right fourth finger laceration.  Does not require sutures and patient does not want sutures.  Will cleanse, apply nonadherent dressing.  Update tetanus.  Strict return precautions given.  Patient verbalizes understanding agrees with plan of  care.      ____________________________________________   FINAL CLINICAL IMPRESSION(S) / ED DIAGNOSES  Final diagnoses:  Laceration of right ring finger without foreign body without damage to nail, initial encounter     ED Discharge Orders    None       Note:  This document was prepared using Dragon voice recognition software and may include unintentional dictation errors.   Irean HongSung, Aubriee Szeto J, MD 10/05/18 [phone]

## 2018-10-05 NOTE — Discharge Instructions (Signed)
Keep wound clean and dry.  Return to the ER for worsening symptoms, increased redness/swelling, purulent discharge or other concerns. 

## 2019-01-28 ENCOUNTER — Other Ambulatory Visit: Payer: Self-pay

## 2019-01-28 ENCOUNTER — Encounter: Payer: Self-pay | Admitting: Emergency Medicine

## 2019-01-28 ENCOUNTER — Emergency Department: Payer: Medicaid Other

## 2019-01-28 ENCOUNTER — Emergency Department
Admission: EM | Admit: 2019-01-28 | Discharge: 2019-01-28 | Disposition: A | Discharge status: Home / Self Care | Payer: Medicaid Other | Attending: Emergency Medicine | Admitting: Emergency Medicine

## 2019-01-28 DIAGNOSIS — B349 Viral infection, unspecified: Secondary | ICD-10-CM

## 2019-01-28 DIAGNOSIS — R5383 Other fatigue: Secondary | ICD-10-CM | POA: Diagnosis present

## 2019-01-28 DIAGNOSIS — Z79899 Other long term (current) drug therapy: Secondary | ICD-10-CM | POA: Diagnosis not present

## 2019-01-28 DIAGNOSIS — U071 COVID-19: Secondary | ICD-10-CM | POA: Insufficient documentation

## 2019-01-28 DIAGNOSIS — F17210 Nicotine dependence, cigarettes, uncomplicated: Secondary | ICD-10-CM | POA: Diagnosis not present

## 2019-01-28 DIAGNOSIS — I10 Essential (primary) hypertension: Secondary | ICD-10-CM | POA: Diagnosis not present

## 2019-01-28 LAB — INFLUENZA PANEL BY PCR (TYPE A & B)
Influenza A By PCR: NEGATIVE
Influenza B By PCR: NEGATIVE

## 2019-01-28 LAB — SARS CORONAVIRUS 2 (TAT 6-24 HRS): SARS Coronavirus 2: POSITIVE — AB

## 2019-01-28 MED ORDER — KETOROLAC TROMETHAMINE 60 MG/2ML IM SOLN
60.0000 mg | Freq: Once | INTRAMUSCULAR | Status: AC
  Administered 2019-01-28: 60 mg via INTRAMUSCULAR
  Filled 2019-01-28: qty 2

## 2019-01-28 NOTE — ED Provider Notes (Signed)
[facility] Emergency Department Provider Note   ____________________________________________   First MD Initiated Contact with Patient 01/28/19 1150     (approximate)  I have reviewed the triage vital signs and the nursing notes.   HISTORY  Chief Complaint Chills, Generalized Body Aches, and Cough    HPI [name] is a 38 y.o. female patient presents with fatigue, chills/fever, decreased taste and smell for 2 days.  Patient state a week ago she attended a party in [facility].  Patient was told today that while in the prison to attend a party tested positive for COVID-19.  Patient she had close contact with this individual.  Patient described her pain/discomfort as "achy.  Patient rates the pain as a 7/10.  No palliative measure for complaint.      Past Medical History:  Diagnosis Date  . Hypertension     Patient Active Problem List   Diagnosis Date Noted  . Acute pyelonephritis 06/21/2018    Past Surgical History:  Procedure Laterality Date  . CESAREAN SECTION    . LAPAROSCOPIC TUBAL LIGATION Bilateral 01/26/2015   Procedure: LAPAROSCOPIC BILATERAL TUBAL LIGATION;  Surgeon: Osborne Oman, MD;  Location: [facility];  Service: Gynecology;  Laterality: Bilateral;  . WISDOM TOOTH EXTRACTION      Prior to Admission medications   Medication Sig Start Date End Date Taking? Authorizing Provider  amLODipine (NORVASC) 5 MG tablet Take 1 tablet (5 mg total) by mouth daily. 06/25/18   Fritzi Mandes, MD  ibuprofen (ADVIL,MOTRIN) 200 MG tablet Take 600 mg by mouth every 6 (six) hours as needed for fever, headache, mild pain, moderate pain or cramping.    [provider]    Allergies Hydrocodone and Tramadol  Family History  Problem Relation Age of Onset  . Hypertension Mother   . Diabetes Mother   . Hyperlipidemia Mother   . Heart disease Mother     Social History Social History   Tobacco Use  . Smoking status:  Current Every Day Smoker    Packs/day: 0.50    Years: 15.00    Pack years: 7.50    Types: Cigarettes  . Smokeless tobacco: Never Used  Substance Use Topics  . Alcohol use: Yes    Alcohol/week: 1.0 standard drinks    Types: 1 Glasses of wine per week    Comment: occasionally   . Drug use: Yes    Types: Marijuana    Review of Systems Constitutional: No fever/chills Eyes: No visual changes. ENT: No sore throat.  Loss of taste and smell. Cardiovascular: Denies chest pain. Respiratory: Denies shortness of breath. Gastrointestinal: No abdominal pain.  No nausea, no vomiting.  No diarrhea.  No constipation. Genitourinary: Negative for dysuria. Musculoskeletal: Negative for back pain. Skin: Negative for rash. Neurological: Negative for headaches, focal weakness or numbness. Endocrine:  Hypertension. Allergic/Immunilogical: Hydrocodone and tramadol ____________________________________________   PHYSICAL EXAM:  VITAL SIGNS: ED Triage Vitals  Enc Vitals Group     BP 01/28/19 1055 (!) 171/99     Pulse Rate 01/28/19 1055 90     Resp 01/28/19 1055 16     Temp 01/28/19 1055 98.5 F (36.9 C)     Temp Source 01/28/19 1055 Oral     SpO2 01/28/19 1055 99 %     Weight 01/28/19 1056 145 lb (65.8 kg)     Height 01/28/19 1056 5\' 4"  (1.626 m)     Head Circumference --      Peak Flow --  Pain Score 01/28/19 1120 7     Pain Loc --      Pain Edu? --      Excl. in GC? --    Constitutional: Alert and oriented. Well appearing and in no acute distress. Mouth/Throat: Mucous membranes are moist.  Oropharynx non-erythematous. Cardiovascular: Normal rate, regular rhythm. Grossly normal heart sounds.  Good peripheral circulation. Respiratory: Normal respiratory effort.  No retractions. Lungs CTAB. Gastrointestinal: Soft and nontender. No distention. No abdominal bruits. No CVA tenderness. Musculoskeletal: No lower extremity tenderness nor edema.  No joint effusions. Neurologic:  Normal  speech and language. No gross focal neurologic deficits are appreciated. No gait instability. Skin:  Skin is warm, dry and intact. No rash noted. Psychiatric: Mood and affect are normal. Speech and behavior are normal.  ____________________________________________   LABS (all labs ordered are listed, but only abnormal results are displayed)  Labs Reviewed  SARS CORONAVIRUS 2 (TAT 6-24 HRS)  INFLUENZA PANEL BY PCR (TYPE A & B)   ____________________________________________  EKG   ____________________________________________  RADIOLOGY  ED MD interpretation:    Official radiology report(s): Dg Chest Portable 1 View  Result Date: 01/28/2019 CLINICAL DATA:  Shortness of breath EXAM: PORTABLE CHEST 1 VIEW COMPARISON:  [name], 2020 FINDINGS: The lungs are clear. The heart size and pulmonary vascularity are normal. No adenopathy. No bone lesions. IMPRESSION: No edema or consolidation.  No evident adenopathy. Electronically Signed   By: Bretta Bang III M.D.   On: 01/28/2019 12:13    ____________________________________________   PROCEDURES  Procedure(s) performed (including Critical Care):  Procedures   ____________________________________________   INITIAL IMPRESSION / ASSESSMENT AND PLAN / ED COURSE  As part of my medical decision making, I reviewed the following data within the electronic MEDICAL RECORD NUMBER       Patient presents for viral illness.  Discussed negative flu results with patient.  COVID-19 test is pending.  Patient advised self quarantine until results are made available.  Follow discharge care instructions.    Zehava D Graham was evaluated in Emergency Department on 01/28/2019 for the symptoms described in the history of present illness. She was evaluated in the context of the global COVID-19 pandemic, which necessitated consideration that the patient might be at risk for infection with the SARS-CoV-2 virus that causes COVID-19. Institutional  protocols and algorithms that pertain to the evaluation of patients at risk for COVID-19 are in a state of rapid change based on information released by regulatory bodies including the CDC and federal and state organizations. These policies and algorithms were followed during the patient's care in the ED.       ____________________________________________   FINAL CLINICAL IMPRESSION(S) / ED DIAGNOSES  Final diagnoses:  Viral illness     ED Discharge Orders    None       Note:  This document was prepared using Dragon voice recognition software and may include unintentional dictation errors.    Joni Reining, PA-C 01/28/19 1358    Sharman Cheek, MD 02/01/19 1504

## 2019-01-28 NOTE — Discharge Instructions (Addendum)
Advised self quarantine pending results of COVID-19 test.  Follow discharge care instructions.  Advised over-the-counter ibuprofen or Tylenol as needed for fever/chills.

## 2019-01-28 NOTE — ED Triage Notes (Signed)
Says no taste or smell for 2 days.  Says she feels bad.

## 2019-01-28 NOTE — ED Notes (Signed)
Pt verbalizes understanding of d/c instructions, medications and follow up 

## 2019-01-28 NOTE — ED Notes (Signed)
See triage note  Presents with possible COVID sx's  States she is having body aches ,decreased taste    States she has been around someone who tested positive

## 2019-01-29 ENCOUNTER — Telehealth: Payer: Self-pay | Admitting: Nurse Practitioner

## 2019-01-29 ENCOUNTER — Other Ambulatory Visit: Payer: Self-pay | Admitting: Nurse Practitioner

## 2019-01-29 NOTE — Telephone Encounter (Signed)
Called to Discuss with patient about Covid symptoms and the use of bamlanivimab, a monoclonal antibody infusion for those with mild to moderate Covid symptoms and at a high risk of hospitalization.     Pt is qualified for this infusion at the [facility] infusion center due to co-morbid conditions and/or a member of an at-risk group.     Patient would like to be scheduled for infusion.

## 2019-01-29 NOTE — Telephone Encounter (Signed)
Patient called. Upon reviewing chart - patient does not qualify for bamlanivimab. Patient understands and will go to urgent care or ED if symptoms worsen.

## 2019-07-26 ENCOUNTER — Emergency Department
Admission: EM | Admit: 2019-07-26 | Discharge: 2019-07-26 | Disposition: A | Discharge status: Home / Self Care | Payer: Medicaid Other | Attending: Emergency Medicine | Admitting: Emergency Medicine

## 2019-07-26 ENCOUNTER — Other Ambulatory Visit: Payer: Self-pay

## 2019-07-26 ENCOUNTER — Encounter: Payer: Self-pay | Admitting: Emergency Medicine

## 2019-07-26 DIAGNOSIS — Z5321 Procedure and treatment not carried out due to patient leaving prior to being seen by health care provider: Secondary | ICD-10-CM | POA: Diagnosis not present

## 2019-07-26 DIAGNOSIS — R42 Dizziness and giddiness: Secondary | ICD-10-CM | POA: Insufficient documentation

## 2019-07-26 DIAGNOSIS — R197 Diarrhea, unspecified: Secondary | ICD-10-CM | POA: Insufficient documentation

## 2019-07-26 DIAGNOSIS — R112 Nausea with vomiting, unspecified: Secondary | ICD-10-CM | POA: Insufficient documentation

## 2019-07-26 LAB — CBC WITH DIFFERENTIAL/PLATELET
Abs Immature Granulocytes: 0.03 10*3/uL (ref 0.00–0.07)
Basophils Absolute: 0 10*3/uL (ref 0.0–0.1)
Basophils Relative: 1 %
Eosinophils Absolute: 0.2 10*3/uL (ref 0.0–0.5)
Eosinophils Relative: 3 %
HCT: 35.6 % — ABNORMAL LOW (ref 36.0–46.0)
Hemoglobin: 12.1 g/dL (ref 12.0–15.0)
Immature Granulocytes: 0 %
Lymphocytes Relative: 24 %
Lymphs Abs: 1.7 10*3/uL (ref 0.7–4.0)
MCH: 29.4 pg (ref 26.0–34.0)
MCHC: 34 g/dL (ref 30.0–36.0)
MCV: 86.4 fL (ref 80.0–100.0)
Monocytes Absolute: 0.5 10*3/uL (ref 0.1–1.0)
Monocytes Relative: 7 %
Neutro Abs: 4.8 10*3/uL (ref 1.7–7.7)
Neutrophils Relative %: 65 %
Platelets: 187 10*3/uL (ref 150–400)
RBC: 4.12 MIL/uL (ref 3.87–5.11)
RDW: 14 % (ref 11.5–15.5)
WBC: 7.3 10*3/uL (ref 4.0–10.5)
nRBC: 0 % (ref 0.0–0.2)

## 2019-07-26 LAB — URINALYSIS, COMPLETE (UACMP) WITH MICROSCOPIC
Bilirubin Urine: NEGATIVE
Glucose, UA: NEGATIVE mg/dL
Hgb urine dipstick: NEGATIVE
Ketones, ur: 5 mg/dL — AB
Nitrite: NEGATIVE
Protein, ur: NEGATIVE mg/dL
Specific Gravity, Urine: 1.026 (ref 1.005–1.030)
pH: 5 (ref 5.0–8.0)

## 2019-07-26 LAB — COMPREHENSIVE METABOLIC PANEL
ALT: 14 U/L (ref 0–44)
AST: 20 U/L (ref 15–41)
Albumin: 4.1 g/dL (ref 3.5–5.0)
Alkaline Phosphatase: 82 U/L (ref 38–126)
Anion gap: 8 (ref 5–15)
BUN: 8 mg/dL (ref 6–20)
CO2: 22 mmol/L (ref 22–32)
Calcium: 8.9 mg/dL (ref 8.9–10.3)
Chloride: 105 mmol/L (ref 98–111)
Creatinine, Ser: 0.56 mg/dL (ref 0.44–1.00)
GFR calc Af Amer: >60 mL/min (ref >60)
GFR calc non Af Amer: >60 mL/min (ref >60)
Glucose, Bld: 95 mg/dL (ref 70–99)
Potassium: 3.5 mmol/L (ref 3.5–5.1)
Sodium: 135 mmol/L (ref 135–145)
Total Bilirubin: 0.5 mg/dL (ref 0.3–1.2)
Total Protein: 7.5 g/dL (ref 6.5–8.1)

## 2019-07-26 LAB — LIPASE, BLOOD: Lipase: 37 U/L (ref 11–51)

## 2019-07-26 MED ORDER — ONDANSETRON 4 MG PO TBDP
4.0000 mg | ORAL_TABLET | Freq: Once | ORAL | Status: AC | PRN
  Administered 2019-07-26: 4 mg via ORAL
  Filled 2019-07-26: qty 1

## 2019-07-26 NOTE — ED Notes (Signed)
Patient to stat desk in no acute distress asking about wait time. Patient given update on wait time. Patient verbalizes understanding.  

## 2019-07-26 NOTE — ED Triage Notes (Signed)
Pt woke up and has been feeling dizzy today. Also c/o NVD with abdominal cramping. Ambulatory to triage.  NAD.  No fever. No urinary sx.  Has not had period for last 2 months.

## 2019-07-27 ENCOUNTER — Telehealth: Payer: Medicaid Other | Admitting: Nurse Practitioner

## 2019-07-27 DIAGNOSIS — R399 Unspecified symptoms and signs involving the genitourinary system: Secondary | ICD-10-CM

## 2019-07-27 DIAGNOSIS — R112 Nausea with vomiting, unspecified: Secondary | ICD-10-CM

## 2019-07-27 DIAGNOSIS — M545 Low back pain, unspecified: Secondary | ICD-10-CM

## 2019-07-27 MED ORDER — ONDANSETRON HCL 4 MG PO TABS: 4.0000 mg | ORAL_TABLET | Freq: Three times a day (TID) | ORAL | 0 refills | Status: DC | PRN

## 2019-07-27 NOTE — Addendum Note (Signed)
Addended by: Bennie Pierini on: 07/27/2019 10:05 AM   Modules accepted: Orders

## 2019-07-27 NOTE — Progress Notes (Signed)
Based on what you shared with me it looks like you have UTI with back pain,that should be evaluated in a face to face office visit. You will need a urinalysis and urine culture for proper treatment.    NOTE: If you entered your credit card information for this eVisit, you will not be charged. You may see a "hold" on your card for the $35 but that hold will drop off and you will not have a charge processed.  If you are having a true medical emergency please call 911.     For an urgent face to face visit, [facility] has four urgent care centers for your convenience:   . [facility]    [phone]                  Get Driving Directions  [address] [place], [place] [address] . 10 am to 8 pm Monday-Friday . 12 pm to 8 pm Saturday-Sunday   . [facility] at [facility]  [phone]                  Get Driving Directions  [address] [place], [place] [address] . 8 am to 8 pm Monday-Friday . 9 am to 6 pm Saturday . 11 am to 6 pm Sunday   . [facility] at [facility]  [phone]                  Get Driving Directions   [address] [place], [place] [address] . 8 am to 8 pm Monday-Friday . 8 am to 4 pm Saturday-Sunday    . [facility] at [facility] Directions  [phone]  [address], [address] [place], [place] [address]  . Monday-Friday, 12 PM to 6 PM    Your e-visit answers were reviewed by a board certified advanced clinical practitioner to complete your personal care plan.  Thank you for using e-Visits.  5-10 minutes spent reviewing and documenting in chart.

## 2019-12-05 ENCOUNTER — Ambulatory Visit: Payer: Self-pay

## 2019-12-05 ENCOUNTER — Telehealth: Payer: Medicaid Other | Admitting: Family

## 2019-12-05 DIAGNOSIS — J029 Acute pharyngitis, unspecified: Secondary | ICD-10-CM | POA: Diagnosis not present

## 2019-12-05 MED ORDER — AMOXICILLIN 500 MG PO CAPS
500.0000 mg | ORAL_CAPSULE | Freq: Two times a day (BID) | ORAL | 0 refills | Status: DC
Start: 2019-12-05 — End: 2020-01-21

## 2019-12-05 NOTE — Progress Notes (Signed)
We are sorry that you are not feeling well.  Here is how we plan to help!  Based on what you have shared with me it is likely that you have strep pharyngitis.  Strep pharyngitis is inflammation and infection in the back of the throat.  This is an infection cause by bacteria and is treated with antibiotics.  I have prescribed Amoxicillin 500 mg twice a day for 10 days. For throat pain, we recommend over the counter oral pain relief medications such as acetaminophen or aspirin, or anti-inflammatory medications such as ibuprofen or naproxen sodium. Topical treatments such as oral throat lozenges or sprays may be used as needed. Strep infections are not as easily transmitted as other respiratory infections, however we still recommend that you avoid close contact with loved ones, especially the very young and elderly.  Remember to wash your hands thoroughly throughout the day as this is the number one way to prevent the spread of infection and wipe down door knobs and counters with disinfectant.   Home Care:  Only take medications as instructed by your medical team.  Complete the entire course of an antibiotic.  Do not take these medications with alcohol.  A steam or ultrasonic humidifier can help congestion.  You can place a towel over your head and breathe in the steam from hot water coming from a faucet.  Avoid close contacts especially the very young and the elderly.  Cover your mouth when you cough or sneeze.  Always remember to wash your hands.  Get Help Right Away If:  You develop worsening fever or sinus pain.  You develop a severe head ache or visual changes.  Your symptoms persist after you have completed your treatment plan.  Make sure you  Understand these instructions.  Will watch your condition.  Will get help right away if you are not doing well or get worse.  Your e-visit answers were reviewed by a board certified advanced clinical practitioner to complete your  personal care plan.  Depending on the condition, your plan could have included both over the counter or prescription medications.  If there is a problem please reply  once you have received a response from your provider.  Your safety is important to [place].  If you have drug allergies check your prescription carefully.    You can use MyChart to ask questions about today's visit, request a non-urgent call back, or ask for a work or school excuse for 24 hours related to this e-Visit. If it has been greater than 24 hours you will need to follow up with your provider, or enter a new e-Visit to address those concerns.  You will get an e-mail in the next two days asking about your experience.  I hope that your e-visit has been valuable and will speed your recovery. Thank you for using e-visits.  Greater than 5 minutes, yet less than 10 minutes of time have been spent researching, coordinating, and implementing care for this patient.

## 2020-01-03 ENCOUNTER — Telehealth: Payer: Medicaid Other | Admitting: Nurse Practitioner

## 2020-01-03 DIAGNOSIS — J029 Acute pharyngitis, unspecified: Secondary | ICD-10-CM | POA: Diagnosis not present

## 2020-01-03 DIAGNOSIS — B349 Viral infection, unspecified: Secondary | ICD-10-CM

## 2020-01-03 NOTE — Progress Notes (Signed)

## 2020-01-19 ENCOUNTER — Encounter: Payer: Self-pay | Admitting: Radiology

## 2020-01-19 ENCOUNTER — Emergency Department: Payer: Medicaid Other

## 2020-01-19 ENCOUNTER — Inpatient Hospital Stay
Admission: EM | Admit: 2020-01-19 | Discharge: 2020-01-21 | DRG: 581 | Disposition: A | Discharge status: Home / Self Care | Payer: Medicaid Other | Attending: Internal Medicine | Admitting: Internal Medicine

## 2020-01-19 ENCOUNTER — Other Ambulatory Visit: Payer: Self-pay

## 2020-01-19 DIAGNOSIS — J029 Acute pharyngitis, unspecified: Secondary | ICD-10-CM

## 2020-01-19 DIAGNOSIS — Z83438 Family history of other disorder of lipoprotein metabolism and other lipidemia: Secondary | ICD-10-CM

## 2020-01-19 DIAGNOSIS — Z833 Family history of diabetes mellitus: Secondary | ICD-10-CM

## 2020-01-19 DIAGNOSIS — Z720 Tobacco use: Secondary | ICD-10-CM

## 2020-01-19 DIAGNOSIS — R059 Cough, unspecified: Secondary | ICD-10-CM

## 2020-01-19 DIAGNOSIS — Z20822 Contact with and (suspected) exposure to covid-19: Secondary | ICD-10-CM | POA: Diagnosis present

## 2020-01-19 DIAGNOSIS — B379 Candidiasis, unspecified: Secondary | ICD-10-CM | POA: Diagnosis not present

## 2020-01-19 DIAGNOSIS — L0211 Cutaneous abscess of neck: Principal | ICD-10-CM | POA: Diagnosis present

## 2020-01-19 DIAGNOSIS — E876 Hypokalemia: Secondary | ICD-10-CM | POA: Diagnosis present

## 2020-01-19 DIAGNOSIS — Z8249 Family history of ischemic heart disease and other diseases of the circulatory system: Secondary | ICD-10-CM | POA: Diagnosis not present

## 2020-01-19 DIAGNOSIS — R197 Diarrhea, unspecified: Secondary | ICD-10-CM | POA: Diagnosis present

## 2020-01-19 DIAGNOSIS — B950 Streptococcus, group A, as the cause of diseases classified elsewhere: Secondary | ICD-10-CM | POA: Diagnosis present

## 2020-01-19 DIAGNOSIS — F17210 Nicotine dependence, cigarettes, uncomplicated: Secondary | ICD-10-CM | POA: Diagnosis present

## 2020-01-19 DIAGNOSIS — I10 Essential (primary) hypertension: Secondary | ICD-10-CM | POA: Diagnosis present

## 2020-01-19 DIAGNOSIS — Z885 Allergy status to narcotic agent status: Secondary | ICD-10-CM | POA: Diagnosis not present

## 2020-01-19 LAB — COMPREHENSIVE METABOLIC PANEL
ALT: 13 U/L (ref 0–44)
AST: 18 U/L (ref 15–41)
Albumin: 3.7 g/dL (ref 3.5–5.0)
Alkaline Phosphatase: 79 U/L (ref 38–126)
Anion gap: 10 (ref 5–15)
BUN: 7 mg/dL (ref 6–20)
CO2: 22 mmol/L (ref 22–32)
Calcium: 9 mg/dL (ref 8.9–10.3)
Chloride: 104 mmol/L (ref 98–111)
Creatinine, Ser: 0.52 mg/dL (ref 0.44–1.00)
GFR, Estimated: >60 mL/min (ref >60)
Glucose, Bld: 99 mg/dL (ref 70–99)
Potassium: 3.1 mmol/L — ABNORMAL LOW (ref 3.5–5.1)
Sodium: 136 mmol/L (ref 135–145)
Total Bilirubin: 0.5 mg/dL (ref 0.3–1.2)
Total Protein: 8.2 g/dL — ABNORMAL HIGH (ref 6.5–8.1)

## 2020-01-19 LAB — CBC WITH DIFFERENTIAL/PLATELET
Abs Immature Granulocytes: 0.04 10*3/uL (ref 0.00–0.07)
Basophils Absolute: 0 10*3/uL (ref 0.0–0.1)
Basophils Relative: 0 %
Eosinophils Absolute: 0 10*3/uL (ref 0.0–0.5)
Eosinophils Relative: 0 %
HCT: 33.6 % — ABNORMAL LOW (ref 36.0–46.0)
Hemoglobin: 11.5 g/dL — ABNORMAL LOW (ref 12.0–15.0)
Immature Granulocytes: 0 %
Lymphocytes Relative: 23 %
Lymphs Abs: 2.2 10*3/uL (ref 0.7–4.0)
MCH: 29.3 pg (ref 26.0–34.0)
MCHC: 34.2 g/dL (ref 30.0–36.0)
MCV: 85.5 fL (ref 80.0–100.0)
Monocytes Absolute: 0.7 10*3/uL (ref 0.1–1.0)
Monocytes Relative: 7 %
Neutro Abs: 6.5 10*3/uL (ref 1.7–7.7)
Neutrophils Relative %: 70 %
Platelets: 305 10*3/uL (ref 150–400)
RBC: 3.93 MIL/uL (ref 3.87–5.11)
RDW: 14.6 % (ref 11.5–15.5)
WBC: 9.5 10*3/uL (ref 4.0–10.5)
nRBC: 0 % (ref 0.0–0.2)

## 2020-01-19 LAB — SEDIMENTATION RATE: Sed Rate: 67 mm/hr — ABNORMAL HIGH (ref 0–20)

## 2020-01-19 LAB — GROUP A STREP BY PCR: Group A Strep by PCR: DETECTED — AB

## 2020-01-19 LAB — APTT: aPTT: 28 seconds (ref 24–36)

## 2020-01-19 LAB — PROTIME-INR
INR: 1 (ref 0.8–1.2)
Prothrombin Time: 12.6 seconds (ref 11.4–15.2)

## 2020-01-19 LAB — CHLAMYDIA/NGC RT PCR (ARMC ONLY)
Chlamydia Tr: NOT DETECTED
N gonorrhoeae: NOT DETECTED

## 2020-01-19 LAB — RESP PANEL BY RT-PCR (FLU A&B, COVID) ARPGX2
Influenza A by PCR: NEGATIVE
Influenza B by PCR: NEGATIVE
SARS Coronavirus 2 by RT PCR: NEGATIVE

## 2020-01-19 LAB — MAGNESIUM: Magnesium: 1.9 mg/dL (ref 1.7–2.4)

## 2020-01-19 LAB — HIV ANTIBODY (ROUTINE TESTING W REFLEX): HIV Screen 4th Generation wRfx: NONREACTIVE

## 2020-01-19 LAB — C-REACTIVE PROTEIN: CRP: 3.9 mg/dL — ABNORMAL HIGH (ref <1.0)

## 2020-01-19 MED ORDER — ACETAMINOPHEN 325 MG PO TABS: 650.0000 mg | ORAL_TABLET | Freq: Four times a day (QID) | ORAL | Status: DC | PRN

## 2020-01-19 MED ORDER — HYDRALAZINE HCL 20 MG/ML IJ SOLN
5.0000 mg | INTRAMUSCULAR | Status: DC | PRN
  Administered 2020-01-21: 5 mg via INTRAVENOUS
  Filled 2020-01-19: qty 1

## 2020-01-19 MED ORDER — IOHEXOL 300 MG/ML  SOLN
75.0000 mL | Freq: Once | INTRAMUSCULAR | Status: AC | PRN
  Administered 2020-01-19: 75 mL via INTRAVENOUS

## 2020-01-19 MED ORDER — MORPHINE SULFATE (PF) 2 MG/ML IV SOLN
2.0000 mg | INTRAVENOUS | Status: DC | PRN
  Administered 2020-01-19 – 2020-01-21 (×9): 2 mg via INTRAVENOUS
  Filled 2020-01-19 (×9): qty 1

## 2020-01-19 MED ORDER — POTASSIUM CHLORIDE 20 MEQ PO PACK
60.0000 meq | PACK | Freq: Once | ORAL | Status: AC
  Administered 2020-01-19: 60 meq via ORAL
  Filled 2020-01-19: qty 3

## 2020-01-19 MED ORDER — SODIUM CHLORIDE 0.9 % IV SOLN
3.0000 g | Freq: Four times a day (QID) | INTRAVENOUS | Status: DC
  Administered 2020-01-19 – 2020-01-21 (×8): 3 g via INTRAVENOUS
  Filled 2020-01-19: qty 8
  Filled 2020-01-19 (×2): qty 3
  Filled 2020-01-19: qty 8
  Filled 2020-01-19: qty 3
  Filled 2020-01-19: qty 8
  Filled 2020-01-19: qty 3
  Filled 2020-01-19 (×3): qty 8
  Filled 2020-01-19: qty 3

## 2020-01-19 MED ORDER — OXYCODONE-ACETAMINOPHEN 5-325 MG PO TABS
1.0000 | ORAL_TABLET | Freq: Once | ORAL | Status: AC
  Administered 2020-01-19: 1 via ORAL
  Filled 2020-01-19: qty 1

## 2020-01-19 MED ORDER — SODIUM CHLORIDE 0.9 % IV SOLN
3.0000 g | Freq: Once | INTRAVENOUS | Status: AC
  Administered 2020-01-19: 3 g via INTRAVENOUS
  Filled 2020-01-19: qty 8

## 2020-01-19 MED ORDER — SODIUM CHLORIDE 0.9 % IV SOLN
INTRAVENOUS | Status: DC | PRN
  Administered 2020-01-19 – 2020-01-20 (×2): 1000 mL via INTRAVENOUS

## 2020-01-19 MED ORDER — POTASSIUM CHLORIDE CRYS ER 20 MEQ PO TBCR: 40.0000 meq | EXTENDED_RELEASE_TABLET | Freq: Once | ORAL | Status: DC

## 2020-01-19 MED ORDER — SODIUM CHLORIDE 0.9 % IV SOLN: INTRAVENOUS | Status: DC

## 2020-01-19 MED ORDER — DEXAMETHASONE SODIUM PHOSPHATE 10 MG/ML IJ SOLN
10.0000 mg | Freq: Once | INTRAMUSCULAR | Status: AC
  Administered 2020-01-19: 10 mg via INTRAVENOUS
  Filled 2020-01-19: qty 1

## 2020-01-19 MED ORDER — SENNOSIDES-DOCUSATE SODIUM 8.6-50 MG PO TABS
1.0000 | ORAL_TABLET | Freq: Every evening | ORAL | Status: DC | PRN
  Administered 2020-01-20: 1 via ORAL
  Filled 2020-01-19: qty 1

## 2020-01-19 MED ORDER — AMLODIPINE BESYLATE 5 MG PO TABS
5.0000 mg | ORAL_TABLET | Freq: Every day | ORAL | Status: DC
  Administered 2020-01-19 – 2020-01-21 (×3): 5 mg via ORAL
  Filled 2020-01-19 (×3): qty 1

## 2020-01-19 MED ORDER — LIDOCAINE HCL (PF) 1 % IJ SOLN
INTRAMUSCULAR | Status: AC
  Filled 2020-01-19: qty 5

## 2020-01-19 MED ORDER — ONDANSETRON HCL 4 MG/2ML IJ SOLN
4.0000 mg | Freq: Three times a day (TID) | INTRAMUSCULAR | Status: DC | PRN
  Administered 2020-01-19: 4 mg via INTRAVENOUS
  Filled 2020-01-19: qty 2

## 2020-01-19 MED ORDER — ONDANSETRON HCL 4 MG/2ML IJ SOLN
4.0000 mg | Freq: Once | INTRAMUSCULAR | Status: AC
  Administered 2020-01-19: 4 mg via INTRAVENOUS
  Filled 2020-01-19: qty 2

## 2020-01-19 MED ORDER — LACTATED RINGERS IV BOLUS
1000.0000 mL | Freq: Once | INTRAVENOUS | Status: AC
  Administered 2020-01-19: 1000 mL via INTRAVENOUS

## 2020-01-19 MED ORDER — OXYCODONE-ACETAMINOPHEN 5-325 MG PO TABS
1.0000 | ORAL_TABLET | ORAL | Status: DC | PRN
  Administered 2020-01-19 – 2020-01-21 (×11): 1 via ORAL
  Filled 2020-01-19 (×11): qty 1

## 2020-01-19 MED ORDER — MORPHINE SULFATE (PF) 4 MG/ML IV SOLN
4.0000 mg | Freq: Once | INTRAVENOUS | Status: AC
  Administered 2020-01-19: 4 mg via INTRAVENOUS
  Filled 2020-01-19: qty 1

## 2020-01-19 MED ORDER — NICOTINE 21 MG/24HR TD PT24
21.0000 mg | MEDICATED_PATCH | Freq: Every day | TRANSDERMAL | Status: DC
  Administered 2020-01-19 – 2020-01-21 (×3): 21 mg via TRANSDERMAL
  Filled 2020-01-19 (×3): qty 1

## 2020-01-19 NOTE — ED Notes (Signed)
This RN to transport pt at this time

## 2020-01-19 NOTE — Progress Notes (Signed)
Pharmacy Antibiotic Note  [name] is a 39 y.o. female admitted on 01/19/2020 with neck abscess. Pharmacy has been consulted for Unasyn dosing.  Plan: Will start Unasyn 3g IV every 6 hours   Height: 5\' 4"  (162.6 cm) Weight: 54.4 kg (120 lb) IBW/kg (Calculated) : 54.7  Temp (24hrs), Avg:98 F (36.7 C), Min:98 F (36.7 C), Max:98 F (36.7 C)  Recent Labs  Lab 01/19/20 0532  WBC 9.5  CREATININE 0.52    Estimated Creatinine Clearance: 80.3 mL/min (by C-G formula based on SCr of 0.52 mg/dL).    Allergies  Allergen Reactions  . Hydrocodone Itching and Other (See Comments)    Headache   . Tramadol Itching    Antimicrobials this admission: 11/21 Unasyn >>   Microbiology results: 11/21 Group A Strep PCR: positive  11/21 BCx: pending   Thank you for allowing pharmacy to be a part of this patient's care.  12/21, PharmD, BCPS Clinical Pharmacist 01/19/2020 8:38 AM

## 2020-01-19 NOTE — ED Provider Notes (Signed)
[facility] Emergency Department Provider Note  ____________________________________________   First MD Initiated Contact with Patient 01/19/20 [phone]     (approximate)  I have reviewed the triage vital signs and the nursing notes.   HISTORY  Chief Complaint Abscess   HPI [name] is a 39 y.o. female with a past medical history of hypertension and recurrent pharyngitis who presents for assessment of approximately 1.5 to 2 weeks of worsening sore throat and swelling on the left side of her neck associate with cough and malaise.  Patient states she has had recurrent infections in her throat but has never had any significant swelling in her neck as she does now.  She states initially started in the left side of her neck but she feels it is spreading to the and front side of her neck.  She denies any fevers, earache, headache, chest pain, shortness of breath, abdominal pain, acute back pain, vomiting, diarrhea, dysuria, rash, recent injuries.  No clear alleviating or rating factors.  She endorses tobacco abuse denies illegal drug use or EtOH abuse.         Past Medical History:  Diagnosis Date  . Hypertension     Patient Active Problem List   Diagnosis Date Noted  . Acute pyelonephritis 06/21/2018    Past Surgical History:  Procedure Laterality Date  . CESAREAN SECTION    . LAPAROSCOPIC TUBAL LIGATION Bilateral 01/26/2015   Procedure: LAPAROSCOPIC BILATERAL TUBAL LIGATION;  Surgeon: Tereso Newcomer, MD;  Location: [facility];  Service: Gynecology;  Laterality: Bilateral;  . WISDOM TOOTH EXTRACTION      Prior to Admission medications   Medication Sig Start Date End Date Taking? Authorizing Provider  amLODipine (NORVASC) 5 MG tablet Take 1 tablet (5 mg total) by mouth daily. 06/25/18   Enedina Finner, MD  amoxicillin (AMOXIL) 500 MG capsule Take 1 capsule (500 mg total) by mouth 2 (two) times daily. 12/05/19   Olive Bass, FNP  ibuprofen  (ADVIL,MOTRIN) 200 MG tablet Take 600 mg by mouth every 6 (six) hours as needed for fever, headache, mild pain, moderate pain or cramping.    [provider]  ondansetron (ZOFRAN) 4 MG tablet Take 1 tablet (4 mg total) by mouth every 8 (eight) hours as needed for nausea or vomiting. 07/27/19   Daphine Deutscher Mary-Margaret, FNP    Allergies Hydrocodone and Tramadol  Family History  Problem Relation Age of Onset  . Hypertension Mother   . Diabetes Mother   . Hyperlipidemia Mother   . Heart disease Mother     Social History Social History   Tobacco Use  . Smoking status: Current Every Day Smoker    Packs/day: 0.50    Years: 15.00    Pack years: 7.50    Types: Cigarettes  . Smokeless tobacco: Never Used  Vaping Use  . Vaping Use: Never used  Substance Use Topics  . Alcohol use: Yes    Alcohol/week: 1.0 standard drink    Types: 1 Glasses of wine per week    Comment: occasionally   . Drug use: Yes    Types: Marijuana    Review of Systems  Review of Systems  Constitutional: Positive for malaise/fatigue. Negative for chills and fever.  HENT: Positive for sore throat.   Eyes: Negative for pain.  Respiratory: Positive for cough. Negative for stridor.   Cardiovascular: Negative for chest pain.  Gastrointestinal: Negative for vomiting.  Genitourinary: Negative for dysuria.  Musculoskeletal: Negative for myalgias.  Skin:  Negative for rash.  Neurological: Negative for seizures, loss of consciousness and headaches.  Psychiatric/Behavioral: Negative for suicidal ideas.  All other systems reviewed and are negative.     ____________________________________________   PHYSICAL EXAM:  VITAL SIGNS: ED Triage Vitals [01/19/20 0514]  Enc Vitals Group     BP      Pulse      Resp      Temp      Temp src      SpO2      Weight 120 lb (54.4 kg)     Height 5\' 4"  (1.626 m)     Head Circumference      Peak Flow      Pain Score 7     Pain Loc      Pain Edu?      Excl. in  GC?    Vitals:   01/19/20 0534  BP: (!) 157/96  Pulse: 85  Resp: 18  Temp: 98 F (36.7 C)  SpO2: 99%   Physical Exam Vitals and nursing note reviewed.  Constitutional:      General: She is not in acute distress.    Appearance: She is well-developed.  HENT:     Head: Normocephalic and atraumatic.     Right Ear: External ear normal.     Left Ear: External ear normal.     Nose: Nose normal.  Eyes:     Conjunctiva/sclera: Conjunctivae normal.  Cardiovascular:     Rate and Rhythm: Normal rate and regular rhythm.     Heart sounds: No murmur heard.   Pulmonary:     Effort: Pulmonary effort is normal. No respiratory distress.     Breath sounds: Normal breath sounds.  Abdominal:     Palpations: Abdomen is soft.     Tenderness: There is no abdominal tenderness.  Musculoskeletal:     Cervical back: Neck supple. Pain with movement present. Decreased range of motion.  Lymphadenopathy:     Cervical: Cervical adenopathy present.     Left cervical: Superficial cervical adenopathy and deep cervical adenopathy present.  Skin:    General: Skin is warm and dry.  Neurological:     Mental Status: She is alert.     There is significant induration tenderness and some fluctuance over the left anterior cervical area and left submental area.  Patient has decreased range of motion on turning her head to the left.  Her tonsils are erythematous enlarged bilaterally but there are no exudates or uvular deviation. ____________________________________________   LABS (all labs ordered are listed, but only abnormal results are displayed)  Labs Reviewed  GROUP A STREP BY PCR - Abnormal; Notable for the following components:      Result Value   Group A Strep by PCR DETECTED (*)    All other components within normal limits  COMPREHENSIVE METABOLIC PANEL - Abnormal; Notable for the following components:   Potassium 3.1 (*)    Total Protein 8.2 (*)    All other components within normal limits    CBC WITH DIFFERENTIAL/PLATELET - Abnormal; Notable for the following components:   Hemoglobin 11.5 (*)    HCT 33.6 (*)    All other components within normal limits  RESP PANEL BY RT-PCR (FLU A&B, COVID) ARPGX2  CHLAMYDIA/NGC RT PCR ([facility] ONLY)  CULTURE, GROUP A STREP ([facility])  HIV ANTIBODY (ROUTINE TESTING W REFLEX)   ____________________________________________  ____________________________________________  RADIOLOGY  ED MD interpretation: Chest x-ray shows no evidence of focal consolidation, overt edema, large  effusion, pneumothorax, or other acute thoracic abnormality.  Official radiology report(s): DG Chest 2 View  Result Date: 01/19/2020 CLINICAL DATA:  Cough. EXAM: CHEST - 2 VIEW COMPARISON:  01/28/2019 and prior radiographs FINDINGS: The cardiomediastinal silhouette is unremarkable. There is no evidence of focal airspace disease, pulmonary edema, suspicious pulmonary nodule/mass, pleural effusion, or pneumothorax. No acute bony abnormalities are identified. IMPRESSION: No active cardiopulmonary disease. Electronically Signed   By: Harmon Pier M.D.   On: 01/19/2020 06:55    ____________________________________________   PROCEDURES  Procedure(s) performed (including Critical Care):  .1-3 Lead EKG Interpretation Performed by: Gilles Chiquito, MD Authorized by: Gilles Chiquito, MD     Interpretation: normal     ECG rate assessment: normal     Rhythm: sinus rhythm     Ectopy: none     Conduction: normal       ____________________________________________   INITIAL IMPRESSION / ASSESSMENT AND PLAN / ED COURSE        Patient presents with [place] to history exam for assessment of sore throat cough associated with swelling and pain in the left side of her neck.  Patient is afebrile hemodynamically stable arrival.  Exam as above.  With regard to patient's sore throat and findings on left side of the neck concern for possible retropharyngeal abscess versus  other deep space infection of the neck. Chest x-ray does not show evidence of pneumonia I suspect patient's cough could be related to her group A strep pharyngitis versus viral bronchitis. She does not appear septic, she has limitation range of motion secondary to pain left side of her neck I suspect this is related to infection in her neck I do not believe she is meningitic. CBC is unremarkable and CMP shows no significant ocular metabolic derangements with the exception of potassium which is slightly low. This was repleted. Covid is negative.   Care patient signed over to oncoming provider at approximately 0 700.  Plan is to follow-up CT neck to assess for evidence of deep space infection of the neck with specific concern for abscess on the left.  If there is some evidence of abscess patient will likely require ENT consult.     ____________________________________________   FINAL CLINICAL IMPRESSION(S) / ED DIAGNOSES  Final diagnoses:  Sore throat  Hypokalemia  Cough  Group A streptococcal infection    Medications  Ampicillin-Sulbactam (UNASYN) 3 g in sodium chloride 0.9 % 100 mL IVPB (has no administration in time range)  lactated ringers bolus 1,000 mL (1,000 mLs Intravenous New Bag/Given 01/19/20 0648)  dexamethasone (DECADRON) injection 10 mg (10 mg Intravenous Given 01/19/20 0647)  potassium chloride (KLOR-CON) packet 60 mEq (60 mEq Oral Given 01/19/20 0647)  iohexol (OMNIPAQUE) 300 MG/ML solution 75 mL (75 mLs Intravenous Contrast Given 01/19/20 0654)     ED Discharge Orders    None       Note:  This document was prepared using Dragon voice recognition software and may include unintentional dictation errors.   Gilles Chiquito, MD 01/19/20 [phone]

## 2020-01-19 NOTE — ED Triage Notes (Signed)
Patient reports redness and swelling to left side of neck that has gotten worse for the past week.

## 2020-01-19 NOTE — ED Notes (Signed)
Attempted to call report to floor at this time. Per Greggory Brandy on 2C, RN unavailable at this time. Ascom number given for callback at this time

## 2020-01-19 NOTE — ED Provider Notes (Signed)
-----------------------------------------   8:24 AM on 01/19/2020 -----------------------------------------  I took over care of this patient from Dr. Katrinka Blazing.  The CT shows a multiloculated left neck abscess.  Antibiotics have already been initiated.  I consulted Dr. Marella Chimes from ENT who recommends admission for antibiotics and observation.  He will evaluate the patient this morning for possible I&D.  The patient remains clinically stable.  I discussed the case with Dr. Clyde Lundborg from the hospitalist service for admission.   Dionne Bucy, MD 01/19/20 [phone]

## 2020-01-19 NOTE — H&P (Signed)
History and Physical    [name] XIP:[id number] DOB: Aug 15, 1980 DOA: 01/19/2020  Referring MD/NP/PA:   PCP: Patient, No Pcp Per   Patient coming from:  The patient is coming from home.  At baseline, pt is independent for most of ADL.        Chief Complaint: neck swelling and pain  HPI: [name] is a 39 y.o. female with medical history significant of recurrent tonsillitis due to strep infection, tobacco abuse, hypertension, who presents with neck swelling and neck pain  Patient states that she has been having left neck swelling and pain for more than 10 days, which has been progressively worsening.  She also has sore throat and mild dry cough.  No shortness breath or chest pain.  Denies fever or chills. Patient reports history of recurrent tonsillitis with recent treatment for strep.  Patient also reports left mandibular molar tooth has been giving her issues and she thinks it is fractured. Patient states that she has been having diarrhea in the past 3 days, with 2-3 times of watery diarrhea each day.  No nausea, vomiting or abdominal pain.  No symptoms of UTI or unilateral weakness.  Of note, patient is allergic to hydrocodone and tramadol, but states that she can tolerate Percocet and morphine.  ED Course: pt was found to have WBC 9.5, negative COVID-19 PCR, positive group B strep, potassium 3.1, renal function okay, temperature normal, blood pressure 146/96, heart rate 85, RR 18, oxygen saturation 100% on room air.  Chest x-ray negative. CT of neck soft tissue shows multiloculated left neck abscess.  Patient is admitted to MedSurg bed as inpt by accepting MD. Dr. Andee Poles of ENT is consulted.  Review of Systems:   General: no fevers, chills, no body weight gain, has poor appetite, has fatigue HEENT: no blurry vision, hearing changes. has sore throat, left neck pain and swelling. Respiratory: no dyspnea, has coughing, no wheezing CV: no chest pain, no palpitations GI: no nausea,  vomiting, abdominal pain, diarrhea, constipation GU: no dysuria, burning on urination, increased urinary frequency, hematuria  Ext: no leg edema Neuro: no unilateral weakness, numbness, or tingling, no vision change or hearing loss Skin: no rash, no skin tear. MSK: No muscle spasm, no deformity, no limitation of range of movement in spin Heme: No easy bruising.  Travel history: No recent long distant travel.  Allergy:  Allergies  Allergen Reactions  . Hydrocodone Itching and Other (See Comments)    Headache   . Tramadol Itching    Past Medical History:  Diagnosis Date  . Hypertension     Past Surgical History:  Procedure Laterality Date  . CESAREAN SECTION    . LAPAROSCOPIC TUBAL LIGATION Bilateral 01/26/2015   Procedure: LAPAROSCOPIC BILATERAL TUBAL LIGATION;  Surgeon: Tereso Newcomer, MD;  Location: [facility];  Service: Gynecology;  Laterality: Bilateral;  . WISDOM TOOTH EXTRACTION      Social History:  reports that she has been smoking cigarettes. She has a 7.50 pack-year smoking history. She has never used smokeless tobacco. She reports current alcohol use of about 1.0 standard drink of alcohol per week. She reports current drug use. Drug: Marijuana.  Family History:  Family History  Problem Relation Age of Onset  . Hypertension Mother   . Diabetes Mother   . Hyperlipidemia Mother   . Heart disease Mother      Prior to Admission medications   Medication Sig Start Date End Date Taking? Authorizing Provider  amLODipine (NORVASC) 5 MG  tablet Take 1 tablet (5 mg total) by mouth daily. 06/25/18   Enedina Finner, MD  amoxicillin (AMOXIL) 500 MG capsule Take 1 capsule (500 mg total) by mouth 2 (two) times daily. 12/05/19   Olive Bass, FNP  ibuprofen (ADVIL,MOTRIN) 200 MG tablet Take 600 mg by mouth every 6 (six) hours as needed for fever, headache, mild pain, moderate pain or cramping.    [provider]  ondansetron (ZOFRAN) 4 MG tablet Take 1 tablet (4 mg  total) by mouth every 8 (eight) hours as needed for nausea or vomiting. 07/27/19   Bennie Pierini, FNP    Physical Exam: Vitals:   01/19/20 0830 01/19/20 0900 01/19/20 0915 01/19/20 0958  BP: (!) 156/92 (!) 145/98 (!) 160/111 (!) 162/102  Pulse: 73 77 83 81  Resp: 18  (!) 25 18  Temp:    98.1 F (36.7 C)  TempSrc:      SpO2: 100% 100% 100% 100%  Weight:      Height:       General: Not in acute distress HEENT:       Eyes: PERRL, EOMI, no scleral icterus.       ENT: No discharge from the ears and nose, no pharynx injection, no tonsillar enlargement.        Neck: No JVD, no bruit. Has swelling and tenderness in left side of neck Heme: No neck lymph node enlargement. Cardiac: S1/S2, RRR, No murmurs, No gallops or rubs. Respiratory: No rales, wheezing, rhonchi or rubs. GI: Soft, nondistended, nontender, no rebound pain, no organomegaly, BS present. GU: No hematuria Ext: No pitting leg edema bilaterally. 1+DP/PT pulse bilaterally. Musculoskeletal: No joint deformities, No joint redness or warmth, no limitation of ROM in spin. Skin: No rashes.  Neuro: Alert, oriented X3, cranial nerves II-XII grossly intact, moves all extremities normally. Muscle strength 5/5 in all extremities, sensation to light touch intact. Brachial reflex 2+ bilaterally. Knee reflex 1+ bilaterally. Negative Babinski's sign. Normal finger to nose test. Psych: Patient is not psychotic, no suicidal or hemocidal ideation.  Labs on Admission: I have personally reviewed following labs and imaging studies  CBC: Recent Labs  Lab 01/19/20 0532  WBC 9.5  NEUTROABS 6.5  HGB 11.5*  HCT 33.6*  MCV 85.5  PLT 305   Basic Metabolic Panel: Recent Labs  Lab 01/19/20 0532 01/19/20 0547  NA 136  --   K 3.1*  --   CL 104  --   CO2 22  --   GLUCOSE 99  --   BUN 7  --   CREATININE 0.52  --   CALCIUM 9.0  --   MG  --  1.9   GFR: Estimated Creatinine Clearance: 80.3 mL/min (by C-G formula based on SCr of 0.52  mg/dL). Liver Function Tests: Recent Labs  Lab 01/19/20 0532  AST 18  ALT 13  ALKPHOS 79  BILITOT 0.5  PROT 8.2*  ALBUMIN 3.7   No results for input(s): LIPASE, AMYLASE in the last 168 hours. No results for input(s): AMMONIA in the last 168 hours. Coagulation Profile: Recent Labs  Lab 01/19/20 0547  INR 1.0   Cardiac Enzymes: No results for input(s): CKTOTAL, CKMB, CKMBINDEX, TROPONINI in the last 168 hours. BNP (last 3 results) No results for input(s): PROBNP in the last 8760 hours. HbA1C: No results for input(s): HGBA1C in the last 72 hours. CBG: No results for input(s): GLUCAP in the last 168 hours. Lipid Profile: No results for input(s): CHOL, HDL, LDLCALC, TRIG, CHOLHDL,  LDLDIRECT in the last 72 hours. Thyroid Function Tests: No results for input(s): TSH, T4TOTAL, FREET4, T3FREE, THYROIDAB in the last 72 hours. Anemia Panel: No results for input(s): VITAMINB12, FOLATE, FERRITIN, TIBC, IRON, RETICCTPCT in the last 72 hours. Urine analysis:    Component Value Date/Time   COLORURINE YELLOW (A) 07/26/2019 1756   APPEARANCEUR HAZY (A) 07/26/2019 1756   LABSPEC 1.026 07/26/2019 1756   PHURINE 5.0 07/26/2019 1756   GLUCOSEU NEGATIVE 07/26/2019 1756   HGBUR NEGATIVE 07/26/2019 1756   BILIRUBINUR NEGATIVE 07/26/2019 1756   KETONESUR 5 (A) 07/26/2019 1756   PROTEINUR NEGATIVE 07/26/2019 1756   UROBILINOGEN 1.0 10/12/2014 1630   NITRITE NEGATIVE 07/26/2019 1756   LEUKOCYTESUR SMALL (A) 07/26/2019 1756   Sepsis Labs: @LABRCNTIP (procalcitonin:4,lacticidven:4) ) Recent Results (from the past 240 hour(s))  Resp Panel by RT-PCR (Flu A&B, Covid) Nasopharyngeal Swab     Status: None   Collection Time: 01/19/20  5:32 AM   Specimen: Nasopharyngeal Swab; Nasopharyngeal(NP) swabs in vial transport medium  Result Value Ref Range Status   SARS Coronavirus 2 by RT PCR NEGATIVE NEGATIVE Final    Comment: (NOTE) SARS-CoV-2 target nucleic acids are NOT DETECTED.  The SARS-CoV-2  RNA is generally detectable in upper respiratory specimens during the acute phase of infection. The lowest concentration of SARS-CoV-2 viral copies this assay can detect is 138 copies/mL. A negative result does not preclude SARS-Cov-2 infection and should not be used as the sole basis for treatment or other patient management decisions. A negative result may occur with  improper specimen collection/handling, submission of specimen other than nasopharyngeal swab, presence of viral mutation(s) within the areas targeted by this assay, and inadequate number of viral copies(<138 copies/mL). A negative result must be combined with clinical observations, patient history, and epidemiological information. The expected result is Negative.  Fact Sheet for Patients:  01/21/20  Fact Sheet for Healthcare Providers:  BloggerCourse.com  This test is no t yet approved or cleared by the [employer].[place] FDA and  has been authorized for detection and/or diagnosis of SARS-CoV-2 by FDA under an Emergency Use Authorization (EUA). This EUA will remain  in effect (meaning this test can be used) for the duration of the COVID-19 declaration under Section 564(b)(1) of the Act, 21 U.S.C.section 360bbb-3(b)(1), unless the authorization is terminated  or revoked sooner.       Influenza A by PCR NEGATIVE NEGATIVE Final   Influenza B by PCR NEGATIVE NEGATIVE Final    Comment: (NOTE) The Xpert Xpress SARS-CoV-2/FLU/RSV plus assay is intended as an aid in the diagnosis of influenza from Nasopharyngeal swab specimens and should not be used as a sole basis for treatment. Nasal washings and aspirates are unacceptable for Xpert Xpress SARS-CoV-2/FLU/RSV testing.  Fact Sheet for Patients: [place]  Fact Sheet for Healthcare Providers: BloggerCourse.com  This test is not yet approved or cleared by the  [employer].[place] FDA and has been authorized for detection and/or diagnosis of SARS-CoV-2 by FDA under an Emergency Use Authorization (EUA). This EUA will remain in effect (meaning this test can be used) for the duration of the COVID-19 declaration under Section 564(b)(1) of the Act, 21 U.S.C. section 360bbb-3(b)(1), unless the authorization is terminated or revoked.  Performed at [facility], [address] [place], [place]   Group A Strep by PCR [place] Only)     Status: Abnormal   Collection Time: 01/19/20  5:33 AM   Specimen: Throat; Sterile Swab  Result Value Ref Range Status   Group  A Strep by PCR DETECTED (A) NOT DETECTED Final    Comment: Performed at [facility], [address] [place], [place] [address]  Chlamydia/NGC rt PCR [facility] only)     Status: None   Collection Time: 01/19/20  5:33 AM   Specimen: Throat  Result Value Ref Range Status   Specimen source GC/Chlam THROAT  Final   Chlamydia Tr NOT DETECTED NOT DETECTED Final   N gonorrhoeae NOT DETECTED NOT DETECTED Final    Comment: (NOTE) This CT/NG assay has not been evaluated in patients with a history of  hysterectomy. Performed at [facility], [address] [place], [place] [address]      Radiological Exams on Admission: DG Chest 2 View  Result Date: 01/19/2020 CLINICAL DATA:  Cough. EXAM: CHEST - 2 VIEW COMPARISON:  01/28/2019 and prior radiographs FINDINGS: The cardiomediastinal silhouette is unremarkable. There is no evidence of focal airspace disease, pulmonary edema, suspicious pulmonary nodule/mass, pleural effusion, or pneumothorax. No acute bony abnormalities are identified. IMPRESSION: No active cardiopulmonary disease. Electronically Signed   By: Harmon PierJeffrey  Hu M.D.   On: 01/19/2020 06:55   CT Soft Tissue Neck W Contrast  Addendum Date: 01/19/2020   ADDENDUM REPORT: 01/19/2020 07:43 ADDENDUM: Study discussed by telephone with Dr. Marisa SeverinSiadecki on 01/19/2020 at 0737  hours. He advises the patient does NOT have a history of IVDA. Therefore, this may be an unusual Pyogenic Lymphadenitis. Electronically Signed   By: Odessa FlemingH  Hall M.D.   On: 01/19/2020 07:43   Result Date: 01/19/2020 CLINICAL DATA:  39 year old female with redness and swelling of the left neck which has progressed for 1 week. Indurated, fluctuant, abscess. EXAM: CT NECK WITH CONTRAST TECHNIQUE: Multidetector CT imaging of the neck was performed using the standard protocol following the bolus administration of intravenous contrast. CONTRAST:  75mL OMNIPAQUE IOHEXOL 300 MG/ML  SOLN COMPARISON:  Neck CT 11/24/2015. FINDINGS: Pharynx and larynx: Mild motion artifact. Laryngeal and pharyngeal soft tissue contours remain within normal limits; symmetric enlargement of the palatine tonsils. There is mild rightward mass effect of the pharynx and larynx at the level of the mandible, although the parapharyngeal and retropharyngeal spaces remain normal. Salivary glands: Chronic sialolith in the right sublingual space now measures up to 6 mm in length (series 2, image 48) and is associated with atrophy right submandibular gland. Left submandibular gland remains within normal limits, although is closely abutted by abnormal rim enhancing fluid along its posterior margin, detailed below. Parotid glands are within normal limits. No other sialolithiasis identified. Thyroid: Remains normal. Lymph nodes: In the superior left neck reactive appearing mildly enlarged and hyperenhancing level 2 lymph nodes measure up to 10 mm short axis. Similar reactive appearing contralateral right level 2 nodes up to 12 mm short axis. At the left level IIIa nodal station, and inseparable from and involving the anterior and medial left sternocleidomastoid muscle there are multiple irregular rim enhancing fluid collections, appearing multiloculated and tracking inferiorly along the medial margin of the left SCM toward the thyroid (coronal images 52 and 55).  The largest round component of fluid seen on series 2, image 51 encompasses about 18 x 20 by 24 mm (AP by transverse by CC) for an estimated volume of 4-5 mL. Thickened irregular surrounding rim enhancement, hyperenhancement of the left sternocleidomastoid muscle which continues inferiorly (series 2, image 66), and this process abuts and mildly displaces the left carotid space. There is D formation of the left internal jugular vein but it remains patent (series 2,  image 53). There is superficial soft tissue swelling and stranding at the level of the strap muscles. Asymmetric reactive appearing level 4 lymph nodes are smaller. No other cystic or necrotic nodes. Vascular: Left carotid space is mildly deformed by the process above, especially the left IJ. But the major vascular structures in the neck and at the skull base remain patent including the left jugular. Limited intracranial: Negative. Visualized orbits: Negative. Mastoids and visualized paranasal sinuses: Clear. Skeleton: Mild motion artifact at the mandible. No acute osseous abnormality identified. Upper chest: Negative visible superior mediastinum. Negative visible upper lungs with only minor dependent atelectasis. Visible axillary lymph nodes are normal. IMPRESSION: 1. Multiloculated Deep Space Neck Abscess on the left, likely originating from multiple suppurative left level 3 lymph nodes. Direct involvement of the left sternocleidomastoid muscle with myositis, and the process abuts and deforms the left carotid space although the Left IJ remains patent. The largest rim enhancing locule of fluid is estimated at 4-5 mL. Only mild regional cellulitis. 2. Superimposed reactive appearing bilateral level 2 through 4 lymph nodes. No other cystic or necrotic nodes. 3. No other acute finding. Chronic right sublingual sialolithiasis with atrophy of the right submandibular gland. Electronically Signed: By: Odessa Fleming M.D. On: 01/19/2020 07:20     EKG: I have  personally reviewed.  Sinus rhythm, QTC 444, mild T wave inversion in V1-V2, early R wave progression  Assessment/Plan Principal Problem:   Neck abscess Active Problems:   Hypertension   Hypokalemia   Tobacco abuse   Diarrhea   Neck abscess: Patient does not have fever or leukocytosis.  Clinically not septic.  Hemodynamically stable. ENT is consulted --> Dr. Andee Poles did I&D  -Patient is admitted to MedSurg bed as inpt by accepting MD -IV Unasyn -Follow-up of blood culture -IV fluid: 1 L of LR -Pain control: As needed Percocet, morphine, Tylenol  Hypertension -Amlodipine -IV hydralazine as needed  Hypokalemia: Potassium 3.1 -Repleted potassium -Check magnesium level  Tobacco abuse -Nicotine patch  Diarrhea -Check C. difficile PCR    DVT ppx: SCD Code Status: Full code Family Communication: not done, no family member is at bed side.     Disposition Plan:  Anticipate discharge back to previous environment Consults called:  Dr. Andee Poles of ENT Admission status: Med-surg bed for obs   Status is: Inpatient  Remains inpatient appropriate because:Inpatient level of care appropriate due to severity of illness.  Patient has multiple medical issues, including recurrent subject tonsillitis, now presents with neck abscess due to positive group A strep.  Patient will need I&D procedure.  Patient also has hypokalemia and a diarrhea.  Her presentation is highly complicated.  Patient is at high risk of deteriorating.  Need to be treated in hospital for at least 2 days.   Dispo: The patient is from: Home              Anticipated d/c is to: Home              Anticipated d/c date is: 2 days              Patient currently is not medically stable to d/c.          Date of Service 01/19/2020    [name] [facility]   If 7PM-7AM, please contact night-coverage [email or web address] 01/19/2020, 12:11 PM

## 2020-01-19 NOTE — Op Note (Signed)
..  01/19/2020  9:55 AM    Mordecai Rasmussen  [id number]   Pre-Op Dx:  Cough [R05.9] Hypokalemia [E87.6] Sore throat [J02.9] Neck abscess [L02.11] Group A streptococcal infection [B95.0]  Post-op Dx: Cough [R05.9] Hypokalemia [E87.6] Sore throat [J02.9] Neck abscess [L02.11] Group A streptococcal infection [B95.0]  Proc: Left neck needle incision and drainage  Surg: Roney Mans Ether Wolters  Anes:  Local  EBL:  None  Comp:  None  Findings:  2.74ml of purulent fluid removed from left neck abscess  Procedure: With the patient in a comfortable supine position, local anesthesia of 2cc's of 1% lidocaine was infiltrated with a 25 gauge 24ml syringe after verbal consent was obtained with risks of bleeding and infection.  The skin was neck prepped in a sterile fashion with alcohol.  With a 22 gauge seeker needle, the abscess cavity was found within the level evel 3 lymph node.  Thick purulence was noted.  An 18 gauge needle was next inserted into the patient's left neck into the suppurative lymph node.  Aspiration of 2.5 to 35ml of purulence was made.  Manipulation of the lymph node was made to ensure adequate drainage.  Substantial decrease in size of left level 2/3 lymph node was noted.  This was sent for aerobic/anaerobic culture.  Patient tolerated the procedure well without complications.  Dispo:  To Floor status  Plan: IV antibiotics/decadron.  Follow culture results.  Will check in a.m.   Roney Mans Berenize Gatlin 9:55 AM 01/19/2020

## 2020-01-19 NOTE — TOC Initial Note (Signed)
Transition of Care [facility]) - Initial/Assessment Note    Patient Details  Name: [name] MRN: [id number] Date of Birth: [birth year]  Transition of Care [facility]) CM/SW Contact:    Luvenia Redden, RN Phone Number: 01/19/2020, 4:15 PM  Clinical Narrative:                 Requested to assist pt with obtaining a primary provider. RN discussed possible providers via [facility]. Pt indicated this was the provider's office note on her insurance card. RN offered to provider contact information to arrange as initial visit. Pt receptive as [facility] RN provided an information for the healthcare providers.   Pt repots she and her son are currently living with her father. Pt verified she is able to afford her medications with sufficient transportation to her medical appointments.  TOC will continue to follow.  Expected Discharge Plan: Home/Self Care Barriers to Discharge: No Barriers Identified   Patient Goals and CMS Choice        Expected Discharge Plan and Services Expected Discharge Plan: Home/Self Care In-house Referral: Clinical Social Work     Living arrangements for the past 2 months: Single Family Home (pt and son lives with her father)                                      Prior Living Arrangements/Services Living arrangements for the past 2 months: Single Family Home (pt and son lives with her father) Lives with:: (P) Parents Patient language and need for interpreter reviewed:: Yes Do you feel safe going back to the place where you live?: Yes      Need for Family Participation in Patient Care: Yes (Comment) Care giver support system in place?: Yes (comment)      Activities of Daily Living Home Assistive Devices/Equipment: None ADL Screening (condition at time of admission) Patient's cognitive ability adequate to safely complete daily activities?: Yes Is the patient deaf or have difficulty hearing?: No Does the patient have difficulty  seeing, even when wearing glasses/contacts?: No Does the patient have difficulty concentrating, remembering, or making decisions?: No Patient able to express need for assistance with ADLs?: Yes Does the patient have difficulty dressing or bathing?: No Independently performs ADLs?: Yes (appropriate for developmental age) Does the patient have difficulty walking or climbing stairs?: No Weakness of Legs: None Weakness of Arms/Hands: None  Permission Sought/Granted   Permission granted to share information with : Yes, Verbal Permission Granted              Emotional Assessment Appearance:: Appears stated age Attitude/Demeanor/Rapport: Engaged Affect (typically observed): Accepting Orientation: : Oriented to Self, Oriented to Place, Oriented to  Time, Oriented to Situation   Psych Involvement: No (comment)  Admission diagnosis:  Cough [R05.9] Hypokalemia [E87.6] Sore throat [J02.9] Neck abscess [L02.11] Group A streptococcal infection [B95.0] Patient Active Problem List   Diagnosis Date Noted  . Neck abscess 01/19/2020  . Diarrhea 01/19/2020  . Hypertension   . Hypokalemia   . Tobacco abuse   . Group A streptococcal infection   . Acute pyelonephritis 06/21/2018   PCP:  Patient, No Pcp Per Pharmacy:   [id number] [place], [place] - [address] [place] [address] Phone: [phone] Fax: [phone]  [id number] - [place], [place] - [address] AT [facility] [address] [place] [address] Phone: [phone] Fax: [phone]  [id number] [place], [place] - [address] AT [facility] OF [address] [place] [address] Phone: [phone] Fax: [phone]     Social Determinants of Health (SDOH) Interventions    Readmission Risk Interventions No flowsheet data found.

## 2020-01-19 NOTE — Consult Note (Signed)
[name], [name] 16, 1982 Crystal Harp, MD  Reason for Consult: Left neck abscess  HPI: 39 y.o. female presented to ER with 1-2 week history of progressive worsening neck pain and sore throat.  Patient reports history of recurrent tonsillitis with recent treatment for strep.  Patient also reports left mandibular molar tooth has been giving her issues and she thinks it is fractured.  She denies breathing difficulties.  Significant improvement in symptoms since having IV antibiotics and steroids earlier while in ED.  No prior history of neck issues.  No IVDU.  Patient reports she is hungry and thirsty.  Allergies:  Allergies  Allergen Reactions  . Hydrocodone Itching and Other (See Comments)    Headache   . Tramadol Itching    ROS: Review of systems normal other than 12 systems except per HPI.  PMH:  Past Medical History:  Diagnosis Date  . Hypertension     FH:  Family History  Problem Relation Age of Onset  . Hypertension Mother   . Diabetes Mother   . Hyperlipidemia Mother   . Heart disease Mother     SH:  Social History   Socioeconomic History  . Marital status: Single    Spouse name: Not on file  . Number of children: Not on file  . Years of education: Not on file  . Highest education level: Not on file  Occupational History  . Not on file  Tobacco Use  . Smoking status: Current Every Day Smoker    Packs/day: 0.50    Years: 15.00    Pack years: 7.50    Types: Cigarettes  . Smokeless tobacco: Never Used  Vaping Use  . Vaping Use: Never used  Substance and Sexual Activity  . Alcohol use: Yes    Alcohol/week: 1.0 standard drink    Types: 1 Glasses of wine per week    Comment: occasionally   . Drug use: Yes    Types: Marijuana  . Sexual activity: Yes    Birth control/protection: Surgical  Other Topics Concern  . Not on file  Social History Narrative   ** Merged History Encounter **       Social Determinants of Health   Financial Resource  Strain:   . Difficulty of Paying Living Expenses: Not on file  Food Insecurity:   . Worried About Programme researcher, broadcasting/film/video in the Last Year: Not on file  . Ran Out of Food in the Last Year: Not on file  Transportation Needs:   . Lack of Transportation (Medical): Not on file  . Lack of Transportation (Non-Medical): Not on file  Physical Activity:   . Days of Exercise per Week: Not on file  . Minutes of Exercise per Session: Not on file  Stress:   . Feeling of Stress : Not on file  Social Connections:   . Frequency of Communication with Friends and Family: Not on file  . Frequency of Social Gatherings with Friends and Family: Not on file  . Attends Religious Services: Not on file  . Active Member of Clubs or Organizations: Not on file  . Attends Banker Meetings: Not on file  . Marital Status: Not on file  Intimate Partner Violence:   . Fear of Current or Ex-Partner: Not on file  . Emotionally Abused: Not on file  . Physically Abused: Not on file  . Sexually Abused: Not on file    PSH:  Past Surgical History:  Procedure Laterality Date  . CESAREAN  SECTION    . LAPAROSCOPIC TUBAL LIGATION Bilateral 01/26/2015   Procedure: LAPAROSCOPIC BILATERAL TUBAL LIGATION;  Surgeon: Tereso Newcomer, MD;  Location: [facility];  Service: Gynecology;  Laterality: Bilateral;  . WISDOM TOOTH EXTRACTION      Physical  Exam:  GEN-  NAD, supine in bed NEURO-CN 2-12 grossly intact and symmetric. EARS- EAC/TMs normal BL. OC/OP-  Oral cavity, lips, gums, ororpharynx normal with no masses or lesions.  Tonsils hypertrophic and cryptic but not erythematous and no exudate EXT: Skin warm and dry.  NOSE-  Nasal cavity without polyps or purulence. NECK-  Left level 2 and level 3 lymphadenopathy and firm induration.  Left SCM with inflammation and hypertrophy from mid-neck down to insertion onto clavicle/sternum RESP-  Unlabored, no stridor or signs of increased accessory muscle use CARD-  RRR  CT  neck-  Right salivary stone in floor of mouth, left level 3 suppurative lymphadenitis with abscess of lymph node just inferior to left submandibular gland.  Hypertrophy and edema of left SCM c/w myositis.  Other enlarged lymph nodes more inferiorly and medially.  Displacement of IJ/Carotid medially but patent.  A/P: Suppurative lymphadenitis with abscess  Plan:  Patient looks suprisingly well considering CT scan.  Most likely source is from tonsils given history but no inflammation or exudate of tonsils.  Possible dental source as well.  No IVDU from patient's history.  Recommend IV abx to cover aerobes and anerobes.  Plan is for I&D, most likely needle this morning.  Consider formal I&D in OR if persists or recurs, but patient prefers to try needle I&D first and antibiotic therapy.   Roney Mans Rutledge Selsor 01/19/2020 9:46 AM

## 2020-01-20 DIAGNOSIS — L0211 Cutaneous abscess of neck: Secondary | ICD-10-CM | POA: Diagnosis not present

## 2020-01-20 DIAGNOSIS — Z720 Tobacco use: Secondary | ICD-10-CM | POA: Diagnosis not present

## 2020-01-20 DIAGNOSIS — I10 Essential (primary) hypertension: Secondary | ICD-10-CM | POA: Diagnosis not present

## 2020-01-20 DIAGNOSIS — E876 Hypokalemia: Secondary | ICD-10-CM | POA: Diagnosis not present

## 2020-01-20 LAB — CBC
HCT: 32.8 % — ABNORMAL LOW (ref 36.0–46.0)
Hemoglobin: 11 g/dL — ABNORMAL LOW (ref 12.0–15.0)
MCH: 29.1 pg (ref 26.0–34.0)
MCHC: 33.5 g/dL (ref 30.0–36.0)
MCV: 86.8 fL (ref 80.0–100.0)
Platelets: 288 10*3/uL (ref 150–400)
RBC: 3.78 MIL/uL — ABNORMAL LOW (ref 3.87–5.11)
RDW: 14.4 % (ref 11.5–15.5)
WBC: 10.3 10*3/uL (ref 4.0–10.5)
nRBC: 0 % (ref 0.0–0.2)

## 2020-01-20 LAB — BASIC METABOLIC PANEL
Anion gap: 7 (ref 5–15)
BUN: 10 mg/dL (ref 6–20)
CO2: 26 mmol/L (ref 22–32)
Calcium: 9.1 mg/dL (ref 8.9–10.3)
Chloride: 105 mmol/L (ref 98–111)
Creatinine, Ser: 0.48 mg/dL (ref 0.44–1.00)
GFR, Estimated: >60 mL/min (ref >60)
Glucose, Bld: 104 mg/dL — ABNORMAL HIGH (ref 70–99)
Potassium: 4 mmol/L (ref 3.5–5.1)
Sodium: 138 mmol/L (ref 135–145)

## 2020-01-20 LAB — GLUCOSE, CAPILLARY: Glucose-Capillary: 96 mg/dL (ref 70–99)

## 2020-01-20 MED ORDER — DEXAMETHASONE SODIUM PHOSPHATE 10 MG/ML IJ SOLN
8.0000 mg | Freq: Three times a day (TID) | INTRAMUSCULAR | Status: DC
  Administered 2020-01-20 – 2020-01-21 (×3): 8 mg via INTRAVENOUS
  Filled 2020-01-20 (×3): qty 1

## 2020-01-20 MED ORDER — SODIUM CHLORIDE 0.9 % IV SOLN: 8.0000 mg | Freq: Three times a day (TID) | INTRAVENOUS | Status: DC

## 2020-01-20 NOTE — Progress Notes (Signed)
1        [place] at [facility]   PATIENT NAME: [name]    MR#:  [id number]  DATE OF BIRTH:  1980/05/28  SUBJECTIVE:  CHIEF COMPLAINT:   Chief Complaint  Patient presents with  . Abscess  Complains of pain and soreness in the left neck area and feels pressure REVIEW OF SYSTEMS:  Review of Systems  Constitutional: Negative for diaphoresis, fever, malaise/fatigue and weight loss.  HENT: Negative for ear discharge, ear pain, hearing loss, nosebleeds, sore throat and tinnitus.   Eyes: Negative for blurred vision and pain.  Respiratory: Negative for cough, hemoptysis, shortness of breath and wheezing.   Cardiovascular: Negative for chest pain, palpitations, orthopnea and leg swelling.  Gastrointestinal: Negative for abdominal pain, blood in stool, constipation, diarrhea, heartburn, nausea and vomiting.  Genitourinary: Negative for dysuria, frequency and urgency.  Musculoskeletal: Positive for neck pain. Negative for back pain and myalgias.  Skin: Negative for itching and rash.  Neurological: Negative for dizziness, tingling, tremors, focal weakness, seizures, weakness and headaches.  Psychiatric/Behavioral: Negative for depression. The patient is not nervous/anxious.    DRUG ALLERGIES:   Allergies  Allergen Reactions  . Hydrocodone Itching and Other (See Comments)    Headache   . Tramadol Itching   VITALS:  Blood pressure (!) 168/99, pulse 69, temperature 97.9 F (36.6 C), temperature source Oral, resp. rate 16, height 5\' 4"  (1.626 m), weight 54.4 kg, last menstrual period 01/05/2020, SpO2 100 %. PHYSICAL EXAMINATION:  Physical Exam HENT:     Head: Normocephalic and atraumatic.  Eyes:     Conjunctiva/sclera: Conjunctivae normal.     Pupils: Pupils are equal, round, and reactive to light.  Neck:     Thyroid: No thyromegaly.     Trachea: No tracheal deviation.     Comments: In duration and pain in the left neck region s/p I&D Cardiovascular:     Rate and  Rhythm: Normal rate and regular rhythm.     Heart sounds: Normal heart sounds.  Pulmonary:     Effort: Pulmonary effort is normal. No respiratory distress.     Breath sounds: Normal breath sounds. No wheezing.  Chest:     Chest wall: No tenderness.  Abdominal:     General: Bowel sounds are normal. There is no distension.     Palpations: Abdomen is soft.     Tenderness: There is no abdominal tenderness.  Musculoskeletal:        General: Normal range of motion.     Cervical back: Normal range of motion and neck supple. Pain with movement and muscular tenderness present.  Skin:    General: Skin is warm and dry.     Findings: No rash.  Neurological:     Mental Status: She is alert and oriented to person, place, and time.     Cranial Nerves: No cranial nerve deficit.    LABORATORY PANEL:  Female CBC Recent Labs  Lab 01/20/20 0523  WBC 10.3  HGB 11.0*  HCT 32.8*  PLT 288   ------------------------------------------------------------------------------------------------------------------ Chemistries  Recent Labs  Lab 01/19/20 0532 01/19/20 0532 01/19/20 0547 01/20/20 0523  NA 136   < >  --  138  K 3.1*   < >  --  4.0  CL 104   < >  --  105  CO2 22   < >  --  26  GLUCOSE 99   < >  --  104*  BUN 7   < >  --  10  CREATININE 0.52   < >  --  0.48  CALCIUM 9.0   < >  --  9.1  MG  --   --  1.9  --   AST 18  --   --   --   ALT 13  --   --   --   ALKPHOS 79  --   --   --   BILITOT 0.5  --   --   --    < > = values in this interval not displayed.   RADIOLOGY:  No results found. ASSESSMENT AND PLAN:  39 year old female admitted for acute left neck abscess  Left neck abscess Status post I&D by Dr. Bud Face on 11/21.  Tried again today but no pus found with needle aspiration Start Decadron 8 mg IV every 8 per ENT IV Unasyn  Hypertension Continue amlodipine IV hydralazine as needed  Hypokalemia Repleted and resolved  Tobacco abuse Counseled, nicotine patch  ordered  Patient denies any diarrhea and requesting to discontinue isolation   Body mass index is 20.6 kg/m.      Status is: Inpatient  Remains inpatient appropriate because:Ongoing active pain requiring inpatient pain management   Dispo: The patient is from: Home              Anticipated d/c is to: Home              Anticipated d/c date is: 1 day              Patient currently is not medically stable to d/c.  Needs better pain control and monitoring of her abscess drainage area.    DVT prophylaxis:       SCDs Start: 01/19/20 1029     Family Communication: "discussed with patient"   All the records are reviewed and case discussed with Care Management/Social Worker. Management plans discussed with the patient, nursing and they are in agreement.  CODE STATUS: Full Code  TOTAL TIME TAKING CARE OF THIS PATIENT: 35 minutes.   More than 50% of the time was spent in counseling/coordination of care: YES  POSSIBLE D/C IN 1-2 DAYS, DEPENDING ON CLINICAL CONDITION.   Delfino Lovett M.D on 01/20/2020 at 1:15 PM  [facility]   CC: Primary care physician; Patient, No Pcp Per  Note: This dictation was prepared with Dragon dictation along with smaller phrase technology. Any transcriptional errors that result from this process are unintentional.

## 2020-01-20 NOTE — Op Note (Signed)
Pre-Op Dx:  Cough [R05.9] Hypokalemia [E87.6] Sore throat [J02.9] Neck abscess [L02.11] Group A streptococcal infection [B95.0]  Post-op Dx: Cough [R05.9] Hypokalemia [E87.6] Sore throat [J02.9] Neck abscess [L02.11] Group A streptococcal infection [B95.0]  Proc: Left neck needle incision and drainage  Surg: Roney Mans Aniqa Hare  Anes:  Local  EBL:  None  Comp:  None  Findings:  no significant fluid noted in previously drained lymph node  Procedure: With the patient in a comfortable supine position, local anesthesia of 2cc's of 1% lidocaine was infiltrated with a 25 gauge 55ml syringe after verbal consent was obtained with risks of bleeding and infection.  The skin was neck prepped in a sterile fashion with alcohol. An 18 gauge needle was next inserted into the patient's left neck into the suppurative lymph node.  Aspiration of was made with no purulent fluid aspirated.  Manipulation of the lymph node was made to ensure adequate drainage with continued lack of fluid aspirated.  Patient tolerated the procedure well without complications.  Dispo:  To Floor status  Plan: IV antibiotics/decadron.  Follow culture results.  Will check in a.m.

## 2020-01-20 NOTE — Progress Notes (Signed)
.. 01/20/2020 8:28 AM  [name], Athalene [id number]     Temp:  [97.9 F (36.6 C)-98.5 F (36.9 C)] 97.9 F (36.6 C) (11/22 0800) Pulse Rate:  [69-83] 69 (11/22 0800) Resp:  [15-25] 16 (11/22 0800) BP: (140-168)/(89-111) 168/99 (11/22 0800) SpO2:  [99 %-100 %] 100 % (11/22 0800),     Intake/Output Summary (Last 24 hours) at 01/20/2020 4098 Last data filed at 01/20/2020 0201 Gross per 24 hour  Intake 339.72 ml  Output --  Net 339.72 ml    Results for orders placed or performed during the hospital encounter of 01/19/20 (from the past 24 hour(s))  Aerobic/Anaerobic Culture (surgical/deep wound)     Status: None (Preliminary result)   Collection Time: 01/19/20  9:47 AM   Specimen: Lymph Node; Abscess  Result Value Ref Range   Specimen Description      LYMPH NODE Performed at [facility], [address], [name], [place] [address]    Special Requests      Normal Performed at [facility], [address] [name], [place] [address]    Gram Stain      MODERATE WBC PRESENT, PREDOMINANTLY PMN MODERATE GRAM POSITIVE COCCI IN CHAINS Performed at [facility], [address] [name], [place] [address]    Culture PENDING    Report Status PENDING   CULTURE, BLOOD (ROUTINE X 2) w Reflex to ID Panel     Status: None (Preliminary result)   Collection Time: 01/19/20 10:03 AM   Specimen: BLOOD  Result Value Ref Range   Specimen Description BLOOD LAC    Special Requests      BOTTLES DRAWN AEROBIC AND ANAEROBIC Blood Culture adequate volume   Culture      NO GROWTH < 24 HOURS Performed at [facility], [address] [place], [place] [address]    Report Status PENDING   Basic metabolic panel     Status: Abnormal   Collection Time: 01/20/20  5:23 AM  Result Value Ref Range   Sodium 138 135 - 145 mmol/L   Potassium 4.0 3.5 - 5.1 mmol/L   Chloride 105 98 - 111 mmol/L   CO2 26 22 - 32 mmol/L   Glucose, Bld 104 (H) 70 - 99 mg/dL   BUN 10 6 -  20 mg/dL   Creatinine, Ser 4.69 0.44 - 1.00 mg/dL   Calcium 9.1 8.9 - 62.9 mg/dL   GFR, Estimated >52 >84 mL/min   Anion gap 7 5 - 15  CBC     Status: Abnormal   Collection Time: 01/20/20  5:23 AM  Result Value Ref Range   WBC 10.3 4.0 - 10.5 K/uL   RBC 3.78 (L) 3.87 - 5.11 MIL/uL   Hemoglobin 11.0 (L) 12.0 - 15.0 g/dL   HCT 13.2 (L) 36 - 46 %   MCV 86.8 80.0 - 100.0 fL   MCH 29.1 26.0 - 34.0 pg   MCHC 33.5 30.0 - 36.0 g/dL   RDW 44.0 10.2 - 72.5 %   Platelets 288 150 - 400 K/uL   nRBC 0.0 0.0 - 0.2 %  Glucose, capillary     Status: None   Collection Time: 01/20/20  7:43 AM  Result Value Ref Range   Glucose-Capillary 96 70 - 99 mg/dL    SUBJECTIVE:  No acute events.  Reports some neck pain this morning but is better than yesterday.  Tolerating diet.  No dysphagia or breathing issues  OBJECTIVE:  NAD, supine in bed NECK-  Decreased but persistent induration and firm left level 2/3 lymph node with more inferiorly enlarged left SCM that is tender and firm  IMPRESSION:  Acute suppurative lymphadenitis  PLAN:  Follow cultures.  Consider repeat I&D with needle today to see if any purulence is left.  Expect slow resolution.  Deny Chevez 01/20/2020, 8:28 AM

## 2020-01-21 DIAGNOSIS — E876 Hypokalemia: Secondary | ICD-10-CM | POA: Diagnosis not present

## 2020-01-21 DIAGNOSIS — J029 Acute pharyngitis, unspecified: Principal | ICD-10-CM

## 2020-01-21 DIAGNOSIS — R059 Cough, unspecified: Secondary | ICD-10-CM | POA: Diagnosis not present

## 2020-01-21 DIAGNOSIS — L0211 Cutaneous abscess of neck: Secondary | ICD-10-CM | POA: Diagnosis not present

## 2020-01-21 LAB — CBC
HCT: 35.2 % — ABNORMAL LOW (ref 36.0–46.0)
Hemoglobin: 12.2 g/dL (ref 12.0–15.0)
MCH: 29.3 pg (ref 26.0–34.0)
MCHC: 34.7 g/dL (ref 30.0–36.0)
MCV: 84.4 fL (ref 80.0–100.0)
Platelets: 331 10*3/uL (ref 150–400)
RBC: 4.17 MIL/uL (ref 3.87–5.11)
RDW: 14.2 % (ref 11.5–15.5)
WBC: 7.1 10*3/uL (ref 4.0–10.5)
nRBC: 0 % (ref 0.0–0.2)

## 2020-01-21 LAB — BASIC METABOLIC PANEL
Anion gap: 10 (ref 5–15)
BUN: 12 mg/dL (ref 6–20)
CO2: 25 mmol/L (ref 22–32)
Calcium: 9.7 mg/dL (ref 8.9–10.3)
Chloride: 103 mmol/L (ref 98–111)
Creatinine, Ser: 0.52 mg/dL (ref 0.44–1.00)
GFR, Estimated: >60 mL/min (ref >60)
Glucose, Bld: 144 mg/dL — ABNORMAL HIGH (ref 70–99)
Potassium: 3.8 mmol/L (ref 3.5–5.1)
Sodium: 138 mmol/L (ref 135–145)

## 2020-01-21 LAB — CULTURE, GROUP A STREP (THRC)

## 2020-01-21 LAB — GLUCOSE, CAPILLARY: Glucose-Capillary: 132 mg/dL — ABNORMAL HIGH (ref 70–99)

## 2020-01-21 MED ORDER — IBUPROFEN 600 MG PO TABS: 600.0000 mg | ORAL_TABLET | Freq: Four times a day (QID) | ORAL | 0 refills | Status: DC | PRN

## 2020-01-21 MED ORDER — DEXAMETHASONE 4 MG PO TABS: 4.0000 mg | ORAL_TABLET | Freq: Three times a day (TID) | ORAL | 0 refills | Status: DC

## 2020-01-21 MED ORDER — AMOXICILLIN-POT CLAVULANATE 875-125 MG PO TABS: 1.0000 | ORAL_TABLET | Freq: Two times a day (BID) | ORAL | 0 refills | Status: AC

## 2020-01-21 MED ORDER — IBUPROFEN 600 MG PO TABS: 600.0000 mg | ORAL_TABLET | Freq: Four times a day (QID) | ORAL | 0 refills | Status: AC | PRN

## 2020-01-21 MED ORDER — AMLODIPINE BESYLATE 5 MG PO TABS: 5.0000 mg | ORAL_TABLET | Freq: Every day | ORAL | 0 refills | Status: AC

## 2020-01-21 MED ORDER — AMOXICILLIN-POT CLAVULANATE 875-125 MG PO TABS: 1.0000 | ORAL_TABLET | Freq: Two times a day (BID) | ORAL | 0 refills | Status: DC

## 2020-01-21 MED ORDER — FLUCONAZOLE 100 MG PO TABS: 100.0000 mg | ORAL_TABLET | Freq: Every day | ORAL | 0 refills | Status: AC

## 2020-01-21 MED ORDER — AMLODIPINE BESYLATE 5 MG PO TABS: 5.0000 mg | ORAL_TABLET | Freq: Every day | ORAL | 0 refills | Status: DC

## 2020-01-21 MED ORDER — OXYCODONE-ACETAMINOPHEN 5-325 MG PO TABS: 1.0000 | ORAL_TABLET | Freq: Three times a day (TID) | ORAL | 0 refills | Status: AC | PRN

## 2020-01-21 MED ORDER — OXYCODONE-ACETAMINOPHEN 5-325 MG PO TABS: 1.0000 | ORAL_TABLET | Freq: Three times a day (TID) | ORAL | 0 refills | Status: DC | PRN

## 2020-01-21 NOTE — Progress Notes (Signed)
Pain noted with flushing of IV. Pain decreases after a couple seconds. Attempted to remove IV three times overnight. Education patient on IV rotation and signs/symptoms of phlebitis. Pt refuses for IV to be removed and states " I'm going home today and I don't want to be stuck again and have another scar." Will continue to monitor.

## 2020-01-21 NOTE — Discharge Instructions (Signed)

## 2020-01-21 NOTE — Progress Notes (Signed)
[name] to be D/C'd home per MD order.  Discussed prescriptions and follow up appointments with the patient. Prescriptions given to patient, medication list explained in detail. Pt verbalized understanding.  Allergies as of 01/21/2020       Reactions   Hydrocodone Itching, Other (See Comments)   Headache    Tramadol Itching        Medication List     STOP taking these medications    amoxicillin 500 MG capsule Commonly known as: AMOXIL   ondansetron 4 MG tablet Commonly known as: Zofran       TAKE these medications    amLODipine 5 MG tablet Commonly known as: NORVASC Take 1 tablet (5 mg total) by mouth daily. Notes to patient: Morning 01/22/20   amoxicillin-clavulanate 875-125 MG tablet Commonly known as: Augmentin Take 1 tablet by mouth every 12 (twelve) hours for 7 days. Notes to patient: Night 01/21/20   dexamethasone 4 MG tablet Commonly known as: DECADRON Take 1 tablet (4 mg total) by mouth 3 (three) times daily. Notes to patient: Afternoon 01/21/20   fluconazole 100 MG tablet Commonly known as: Diflucan Take 1 tablet (100 mg total) by mouth daily for 3 days. Notes to patient: Morning 01/22/20   ibuprofen 600 MG tablet Commonly known as: IBU Take 1 tablet (600 mg total) by mouth every 6 (six) hours as needed for up to 10 days for fever, headache, mild pain, moderate pain or cramping. What changed: medication strength Notes to patient: As needed   oxyCODONE-acetaminophen 5-325 MG tablet Commonly known as: PERCOCET/ROXICET Take 1 tablet by mouth every 8 (eight) hours as needed for up to 5 days for severe pain. Notes to patient: As needed        Vitals:   01/21/20 0618 01/21/20 0805  BP: (!) 169/94 (!) 150/88  Pulse:  68  Resp:  16  Temp:  97.8 F (36.6 C)  SpO2:  100%    Skin clean, dry and intact without evidence of skin break down, no evidence of skin tears noted. IV catheter discontinued intact. Site without signs and symptoms of  complications. Dressing and pressure applied. Pt denies pain at this time. No complaints noted.  An After Visit Summary was printed and given to the patient. Patient escorted to exit, and D/C home via private auto.  Anetha Slagel A Ingram Onnen

## 2020-01-21 NOTE — Final Consult Note (Signed)
..   01/21/2020 7:25 AM  [name], [name] [id number]   Temp:  [97.8 F (36.6 C)-98.9 F (37.2 C)] 97.8 F (36.6 C) (11/23 0403) Pulse Rate:  [67-74] 67 (11/23 0403) Resp:  [16-17] 16 (11/23 0403) BP: (136-175)/(84-109) 169/94 (11/23 0618) SpO2:  [99 %-100 %] 100 % (11/23 0403),     Intake/Output Summary (Last 24 hours) at 01/21/2020 0725 Last data filed at 01/20/2020 2345 Gross per 24 hour  Intake 600 ml  Output --  Net 600 ml    Results for orders placed or performed during the hospital encounter of 01/19/20 (from the past 24 hour(s))  Glucose, capillary     Status: None   Collection Time: 01/20/20  7:43 AM  Result Value Ref Range   Glucose-Capillary 96 70 - 99 mg/dL  CBC     Status: Abnormal   Collection Time: 01/21/20  5:22 AM  Result Value Ref Range   WBC 7.1 4.0 - 10.5 K/uL   RBC 4.17 3.87 - 5.11 MIL/uL   Hemoglobin 12.2 12.0 - 15.0 g/dL   HCT 81.4 (L) 36 - 46 %   MCV 84.4 80.0 - 100.0 fL   MCH 29.3 26.0 - 34.0 pg   MCHC 34.7 30.0 - 36.0 g/dL   RDW 48.1 85.6 - 31.4 %   Platelets 331 150 - 400 K/uL   nRBC 0.0 0.0 - 0.2 %  Basic metabolic panel     Status: Abnormal   Collection Time: 01/21/20  5:22 AM  Result Value Ref Range   Sodium 138 135 - 145 mmol/L   Potassium 3.8 3.5 - 5.1 mmol/L   Chloride 103 98 - 111 mmol/L   CO2 25 22 - 32 mmol/L   Glucose, Bld 144 (H) 70 - 99 mg/dL   BUN 12 6 - 20 mg/dL   Creatinine, Ser 9.70 0.44 - 1.00 mg/dL   Calcium 9.7 8.9 - 26.3 mg/dL   GFR, Estimated >78 >58 mL/min   Anion gap 10 5 - 15    SUBJECTIVE:  No acute events.  Pain improved except with IV.  Tolerating PO. Ambulating.  Wants to go home  OBJECTIVE:  NAD, supine in bed NECK improved left level 3 lymph node with improved tenderness, continued induration of left SCM, continue left level 2 throuh 4 lymphadenopathy  Culture- gram + cocci in chains  IMPRESSION:  Suppurative lymphadenopathy improving with most likely strep on culture with gram stain showing gram +  cocci in chains  PLAN:  Given improvement, patient ok to discharge home.  Recommend discharge with 2 weeks of abx such as clindamycin 300mg  PO TID x 14 days and sterapred DS 6 day taper, diflucan for yeast infection.  I will plan on seeing patient on Monday 11/29 in a.m. for follow up.  Continue intermittent heat.   Samuella Rasool 01/21/2020, 7:25 AM

## 2020-01-21 NOTE — Discharge Summary (Signed)
5        [place] at [facility]   PATIENT NAME: [name]    MR#:  [id number]  DATE OF BIRTH:  [birth year]  DATE OF ADMISSION:  01/19/2020   ADMITTING PHYSICIAN: [name], MD  DATE OF DISCHARGE: 01/21/2020 12:18 PM  PRIMARY CARE PHYSICIAN: Patient, No Pcp Per   ADMISSION DIAGNOSIS:  Cough [R05.9] Hypokalemia [E87.6] Sore throat [J02.9] Neck abscess [L02.11] Group A streptococcal infection [B95.0] DISCHARGE DIAGNOSIS:  Principal Problem:   Neck abscess Active Problems:   Hypertension   Hypokalemia   Tobacco abuse   Diarrhea   Sore throat   Cough  SECONDARY DIAGNOSIS:   Past Medical History:  Diagnosis Date  . Hypertension    HOSPITAL COURSE:  39 year old female admitted for acute left neck abscess  Left neck abscess Status post I&D by Dr. Bud Face on 11/21.  Improving on Decadron, antibiotic and pain medicine -Also given Diflucan for yeast infection at discharge -Outpatient follow-up with ENT  Hypertension Continue amlodipine  Hypokalemia Repleted and resolved  Tobacco abuse Counseled   DISCHARGE CONDITIONS:  Stable CONSULTS OBTAINED:   DRUG ALLERGIES:   Allergies  Allergen Reactions  . Hydrocodone Itching and Other (See Comments)    Headache   . Tramadol Itching   DISCHARGE MEDICATIONS:   Allergies as of 01/21/2020      Reactions   Hydrocodone Itching, Other (See Comments)   Headache    Tramadol Itching      Medication List    STOP taking these medications   amoxicillin 500 MG capsule Commonly known as: AMOXIL   ondansetron 4 MG tablet Commonly known as: Zofran     TAKE these medications   amLODipine 5 MG tablet Commonly known as: NORVASC Take 1 tablet (5 mg total) by mouth daily. Notes to patient: Morning 01/22/20   amoxicillin-clavulanate 875-125 MG tablet Commonly known as: Augmentin Take 1 tablet by mouth every 12 (twelve) hours for 7 days. Notes to patient: Night 01/21/20   dexamethasone 4  MG tablet Commonly known as: DECADRON Take 1 tablet (4 mg total) by mouth 3 (three) times daily. Notes to patient: Afternoon 01/21/20   fluconazole 100 MG tablet Commonly known as: Diflucan Take 1 tablet (100 mg total) by mouth daily for 3 days. Notes to patient: Morning 01/22/20   ibuprofen 600 MG tablet Commonly known as: IBU Take 1 tablet (600 mg total) by mouth every 6 (six) hours as needed for up to 10 days for fever, headache, mild pain, moderate pain or cramping. What changed: medication strength Notes to patient: As needed   oxyCODONE-acetaminophen 5-325 MG tablet Commonly known as: PERCOCET/ROXICET Take 1 tablet by mouth every 8 (eight) hours as needed for up to 5 days for severe pain. Notes to patient: As needed      DISCHARGE INSTRUCTIONS:   DIET:  Regular diet DISCHARGE CONDITION:  Good ACTIVITY:  Activity as tolerated OXYGEN:  Home Oxygen: No.  Oxygen Delivery: room air DISCHARGE LOCATION:  home   If you experience worsening of your admission symptoms, develop shortness of breath, life threatening emergency, suicidal or homicidal thoughts you must seek medical attention immediately by calling 911 or calling your MD immediately  if symptoms less severe.  You Must read complete instructions/literature along with all the possible adverse reactions/side effects for all the Medicines you take and that have been prescribed to you. Take any new Medicines after you have completely understood and accpet all the possible adverse reactions/side effects.  Please note  You were cared for by a hospitalist during your hospital stay. If you have any questions about your discharge medications or the care you received while you were in the hospital after you are discharged, you can call the unit and asked to speak with the hospitalist on call if the hospitalist that took care of you is not available. Once you are discharged, your primary care physician will handle any further  medical issues. Please note that NO REFILLS for any discharge medications will be authorized once you are discharged, as it is imperative that you return to your primary care physician (or establish a relationship with a primary care physician if you do not have one) for your aftercare needs so that they can reassess your need for medications and monitor your lab values.    On the day of Discharge:  VITAL SIGNS:  Blood pressure (!) 150/88, pulse 68, temperature 97.8 F (36.6 C), temperature source Oral, resp. rate 16, height 5\' 4"  (1.626 m), weight 54.4 kg, last menstrual period 01/05/2020, SpO2 100 %. PHYSICAL EXAMINATION:  GENERAL:  39 y.o.-year-old patient lying in the bed with no acute distress.  EYES: Pupils equal, round, reactive to light and accommodation. No scleral icterus. Extraocular muscles intact.  HEENT: Head atraumatic, normocephalic. Oropharynx and nasopharynx clear.  NECK:  Supple, no jugular venous distention. No thyroid enlargement, no tenderness.  LUNGS: Normal breath sounds bilaterally, no wheezing, rales,rhonchi or crepitation. No use of accessory muscles of respiration.  CARDIOVASCULAR: S1, S2 normal. No murmurs, rubs, or gallops.  ABDOMEN: Soft, non-tender, non-distended. Bowel sounds present. No organomegaly or mass.  EXTREMITIES: No pedal edema, cyanosis, or clubbing.  NEUROLOGIC: Cranial nerves II through XII are intact. Muscle strength 5/5 in all extremities. Sensation intact. Gait not checked.  PSYCHIATRIC: The patient is alert and oriented x 3.  SKIN: No obvious rash, lesion, or ulcer.  DATA REVIEW:   CBC Recent Labs  Lab 01/21/20 0522  WBC 7.1  HGB 12.2  HCT 35.2*  PLT 331    Chemistries  Recent Labs  Lab 01/19/20 0532 01/19/20 0547 01/20/20 0523 01/21/20 0522  NA 136  --    < > 138  K 3.1*  --    < > 3.8  CL 104  --    < > 103  CO2 22  --    < > 25  GLUCOSE 99  --    < > 144*  BUN 7  --    < > 12  CREATININE 0.52  --    < > 0.52  CALCIUM  9.0  --    < > 9.7  MG  --  1.9  --   --   AST 18  --   --   --   ALT 13  --   --   --   ALKPHOS 79  --   --   --   BILITOT 0.5  --   --   --    < > = values in this interval not displayed.     Outpatient follow-up  Follow-up Information    01/23/20, MD. Go on 01/27/2020.   Specialty: Otolaryngology Why: as scheduled Contact information: [address] [place] [id number]                Management plans discussed with the patient, family and they are in agreement.  CODE STATUS: Full Code   TOTAL TIME TAKING CARE OF  THIS PATIENT: 45 minutes.    Delfino Lovett M.D on 01/21/2020 at 4:47 PM  [facility]   CC: Primary care physician; Patient, No Pcp Per   Note: This dictation was prepared with Dragon dictation along with smaller phrase technology. Any transcriptional errors that result from this process are unintentional.

## 2020-01-21 NOTE — TOC Transition Note (Signed)
Transition of Care [facility]) - CM/SW Discharge Note   Patient Details  Name: Daphnee JALYNN WADDELL MRN: [id number] Date of Birth: 22-Aug-1980  Transition of Care [facility]) CM/SW Contact:  Margarito Liner, LCSW Phone Number: 01/21/2020, 10:01 AM   Clinical Narrative:  Patient has orders to discharge home today. No further concerns. CSW signing off.   Final next level of care: Home/Self Care Barriers to Discharge: Barriers Resolved   Patient Goals and CMS Choice        Discharge Placement                    Patient and family notified of of transfer: 01/21/20  Discharge Plan and Services In-house Referral: Clinical Social Work                                   Social Determinants of Health (SDOH) Interventions     Readmission Risk Interventions No flowsheet data found.

## 2020-01-24 LAB — CULTURE, BLOOD (ROUTINE X 2)
Culture: NO GROWTH
Culture: NO GROWTH
Special Requests: ADEQUATE
Special Requests: ADEQUATE

## 2020-01-24 LAB — AEROBIC/ANAEROBIC CULTURE W GRAM STAIN (SURGICAL/DEEP WOUND): Special Requests: NORMAL

## 2020-12-17 IMAGING — CT CT NECK W/ CM
3 of 4 series · 9 of 33 positions shown, 11 images · IV contrast (omnipaque)
Comparison: Neck CT 11/24/2015.
COMPARISON: Neck CT 11/24/2015.

Addendum:
CLINICAL DATA: 39-year-old female with redness and swelling of the
left neck which has progressed for 1 week. Indurated, fluctuant,
abscess.

EXAM:
CT NECK WITH CONTRAST
TECHNIQUE: Multidetector CT imaging of the neck was performed using the
standard protocol following the bolus administration of intravenous
contrast.
CONTRAST:  75mL OMNIPAQUE IOHEXOL 300 MG/ML  SOLN

[Series 5: sag neck · sagittal · 0.41mm/px · 5 of 85 slices shown, 6 images]
[im 29/85  bone]
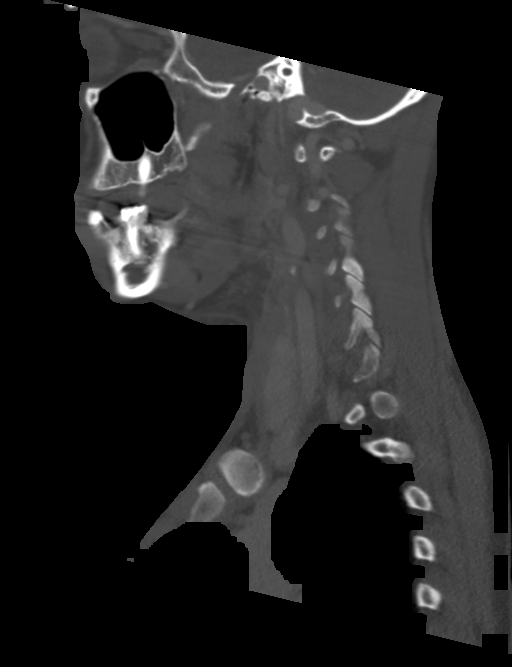
[im 36/85  bone]
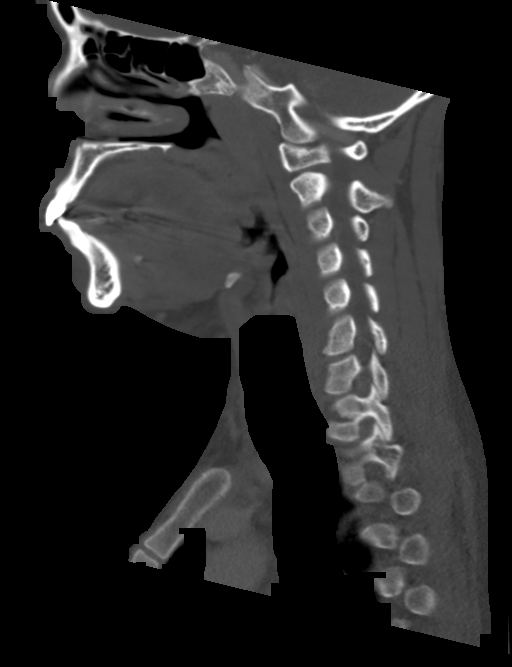
[im 43/85  soft-tissue]
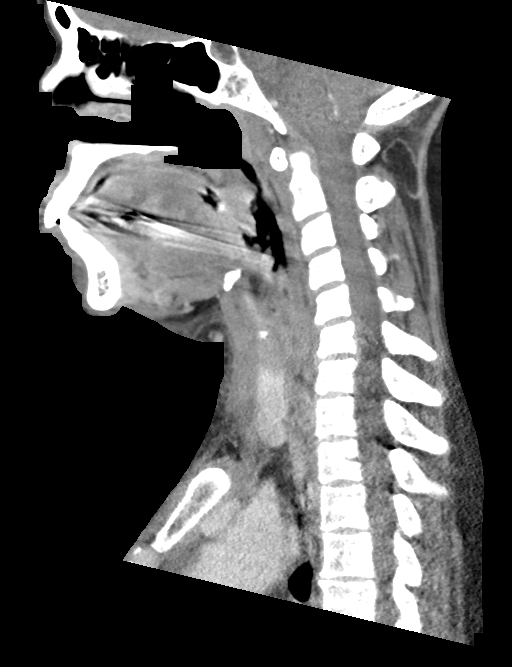
[im 43/85  bone]
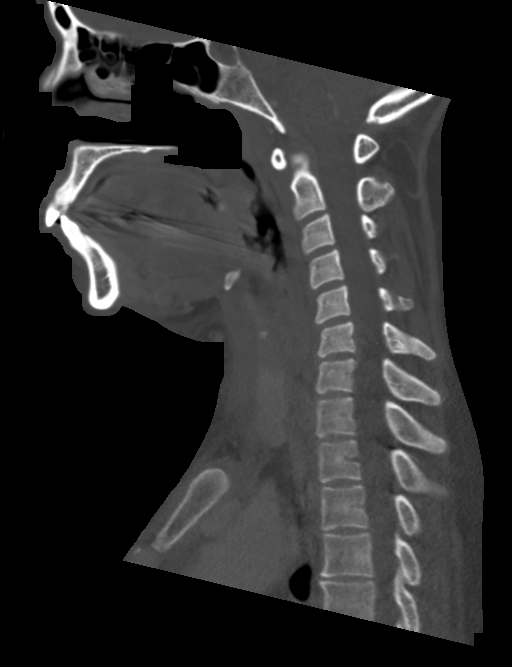
[im 50/85  bone]
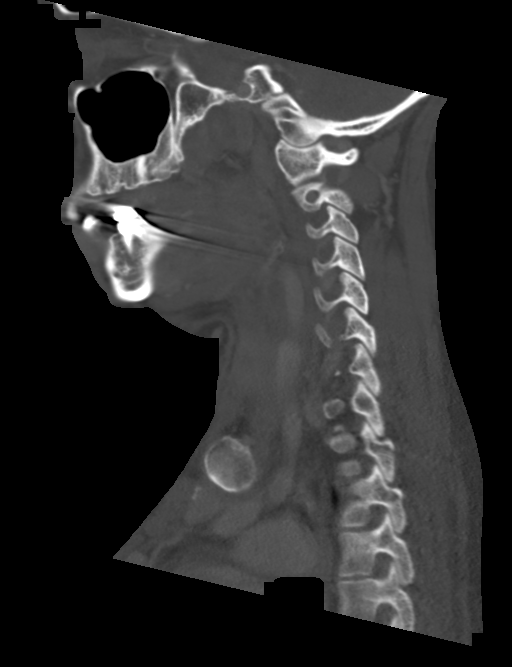
[im 57/85  bone]
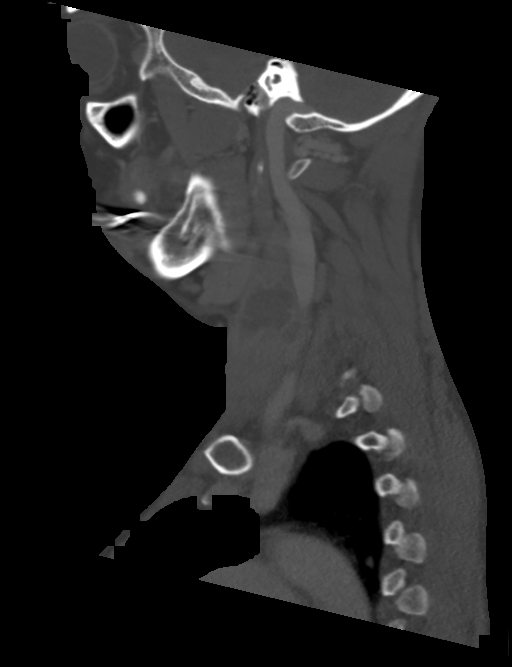

[Series 6: cor neck · coronal · 0.33mm/px · 3 of 106 slices shown]
[im 22/106  bone]
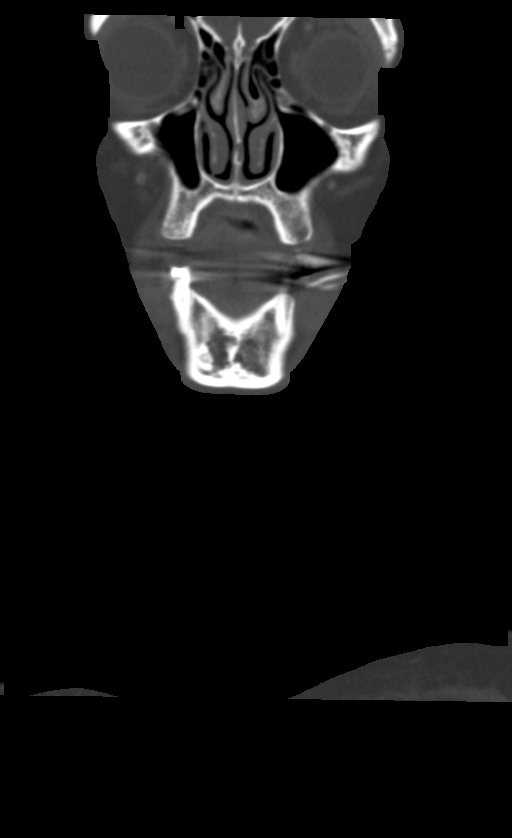
[im 43/106  bone]
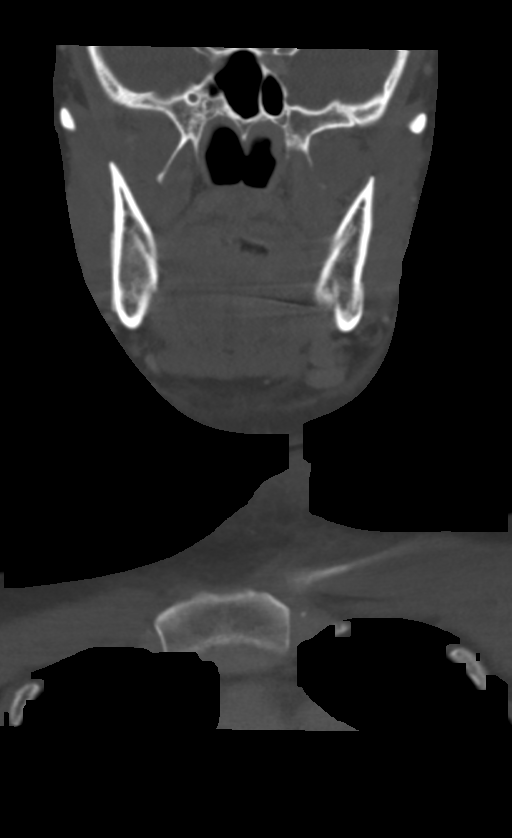
[im 64/106  bone]
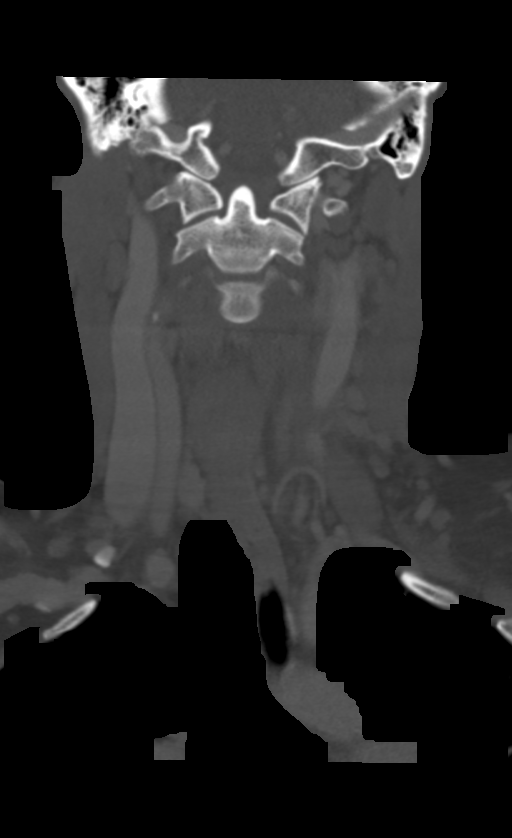

[Series 7: orthogonal ax · axial · 0.33mm/px · z∈[-186,-186]mm · 1 of 136 slices shown, 2 images]
[im 78/136  soft-tissue]
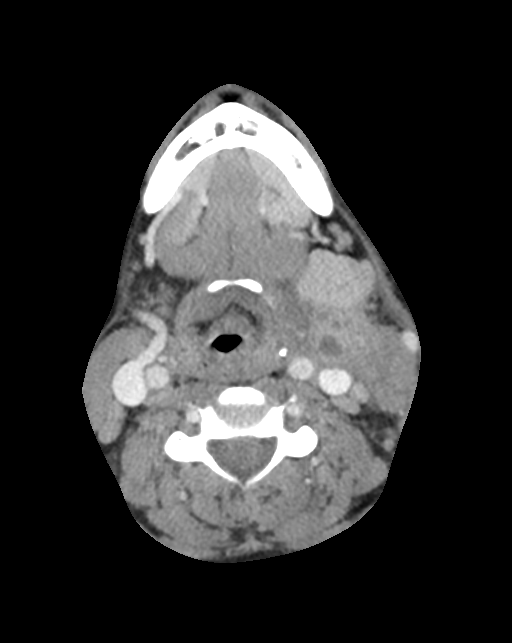
[im 78/136  bone]
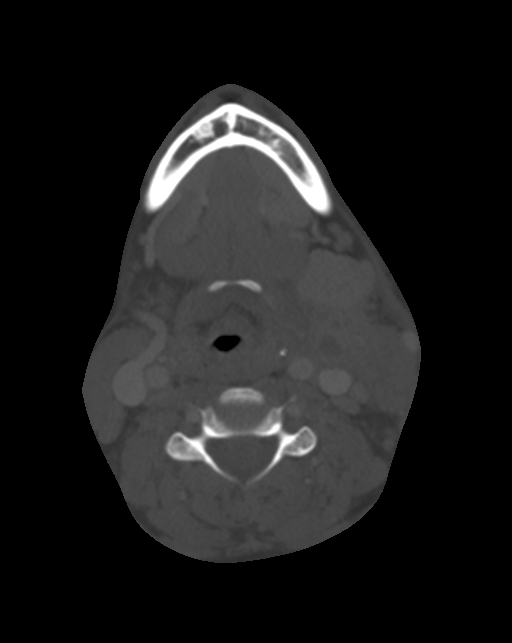

[9 of 33 positions shown; findings below may reference images not displayed]

FINDINGS: Pharynx and larynx: Mild motion artifact. Laryngeal and pharyngeal
soft tissue contours remain within normal limits; symmetric
enlargement of the palatine tonsils.

There is mild rightward mass effect of the pharynx and larynx at the
level of the mandible, although the parapharyngeal and
retropharyngeal spaces remain normal.

Salivary glands: Chronic sialolith in the right sublingual space now
measures up to 6 mm in length (series 2, image 48) and is associated
with atrophy right submandibular gland. Left submandibular gland
remains within normal limits, although is closely abutted by
abnormal rim enhancing fluid along its posterior margin, detailed
below.

Parotid glands are within normal limits. No other sialolithiasis
identified.

Thyroid: Remains normal.

Lymph nodes: In the superior left neck reactive appearing mildly
enlarged and hyperenhancing level 2 lymph nodes measure up to 10 mm
short axis. Similar reactive appearing contralateral right level 2
nodes up to 12 mm short axis.

At the left level IIIa nodal station, and inseparable from and
involving the anterior and medial left sternocleidomastoid muscle
there are multiple irregular rim enhancing fluid collections,
appearing multiloculated and tracking inferiorly along the medial
margin of the left SCM toward the thyroid (coronal images 52 and
55). The largest round component of fluid seen on series 2, image 51
encompasses about 18 x 20 by 24 mm (AP by transverse by CC) for an
estimated volume of 4-5 mL.

Thickened irregular surrounding rim enhancement, hyperenhancement of
the left sternocleidomastoid muscle which continues inferiorly
(series 2, image 66), and this process abuts and mildly displaces
the left carotid space. There is D formation of the left internal
jugular vein but it remains patent (series 2, image 53). There is
superficial soft tissue swelling and stranding at the level of the
strap muscles.

Asymmetric reactive appearing level 4 lymph nodes are smaller. No
other cystic or necrotic nodes.

Vascular: Left carotid space is mildly deformed by the process
above, especially the left IJ. But the major vascular structures in
the neck and at the skull base remain patent including the left
jugular.

Limited intracranial: Negative.

Visualized orbits: Negative.

Mastoids and visualized paranasal sinuses: Clear.

Skeleton: Mild motion artifact at the mandible. No acute osseous
abnormality identified.

Upper chest: Negative visible superior mediastinum. Negative visible
upper lungs with only minor dependent atelectasis. Visible axillary
lymph nodes are normal.
IMPRESSION: 1. Multiloculated Deep Space Neck Abscess on the left, likely
originating from multiple suppurative left level 3 lymph nodes.
Direct involvement of the left sternocleidomastoid muscle with
myositis, and the process abuts and deforms the left carotid space
although the Left IJ remains patent.
The largest rim enhancing locule of fluid is estimated at 4-5 mL.
Only mild regional cellulitis.

2. Superimposed reactive appearing bilateral level 2 through 4 lymph
nodes. No other cystic or necrotic nodes.

3. No other acute finding. Chronic right sublingual sialolithiasis
with atrophy of the right submandibular gland.

ADDENDUM:
Study discussed by telephone with Dr. Ronlor on 01/19/2020 at 2393
hours. He advises the patient does NOT have a history of IVDA.
Therefore, this may be an unusual Pyogenic Lymphadenitis.

*** End of Addendum ***
FINDINGS: Pharynx and larynx: Mild motion artifact. Laryngeal and pharyngeal
soft tissue contours remain within normal limits; symmetric
enlargement of the palatine tonsils.

There is mild rightward mass effect of the pharynx and larynx at the
level of the mandible, although the parapharyngeal and
retropharyngeal spaces remain normal.

Salivary glands: Chronic sialolith in the right sublingual space now
measures up to 6 mm in length (series 2, image 48) and is associated
with atrophy right submandibular gland. Left submandibular gland
remains within normal limits, although is closely abutted by
abnormal rim enhancing fluid along its posterior margin, detailed
below.

Parotid glands are within normal limits. No other sialolithiasis
identified.

Thyroid: Remains normal.

Lymph nodes: In the superior left neck reactive appearing mildly
enlarged and hyperenhancing level 2 lymph nodes measure up to 10 mm
short axis. Similar reactive appearing contralateral right level 2
nodes up to 12 mm short axis.

At the left level IIIa nodal station, and inseparable from and
involving the anterior and medial left sternocleidomastoid muscle
there are multiple irregular rim enhancing fluid collections,
appearing multiloculated and tracking inferiorly along the medial
margin of the left SCM toward the thyroid (coronal images 52 and
55). The largest round component of fluid seen on series 2, image 51
encompasses about 18 x 20 by 24 mm (AP by transverse by CC) for an
estimated volume of 4-5 mL.

Thickened irregular surrounding rim enhancement, hyperenhancement of
the left sternocleidomastoid muscle which continues inferiorly
(series 2, image 66), and this process abuts and mildly displaces
the left carotid space. There is D formation of the left internal
jugular vein but it remains patent (series 2, image 53). There is
superficial soft tissue swelling and stranding at the level of the
strap muscles.

Asymmetric reactive appearing level 4 lymph nodes are smaller. No
other cystic or necrotic nodes.

Vascular: Left carotid space is mildly deformed by the process
above, especially the left IJ. But the major vascular structures in
the neck and at the skull base remain patent including the left
jugular.

Limited intracranial: Negative.

Visualized orbits: Negative.

Mastoids and visualized paranasal sinuses: Clear.

Skeleton: Mild motion artifact at the mandible. No acute osseous
abnormality identified.

Upper chest: Negative visible superior mediastinum. Negative visible
upper lungs with only minor dependent atelectasis. Visible axillary
lymph nodes are normal.
IMPRESSION: 1. Multiloculated Deep Space Neck Abscess on the left, likely
originating from multiple suppurative left level 3 lymph nodes.
Direct involvement of the left sternocleidomastoid muscle with
myositis, and the process abuts and deforms the left carotid space
although the Left IJ remains patent.
The largest rim enhancing locule of fluid is estimated at 4-5 mL.
Only mild regional cellulitis.

2. Superimposed reactive appearing bilateral level 2 through 4 lymph
nodes. No other cystic or necrotic nodes.

3. No other acute finding. Chronic right sublingual sialolithiasis
with atrophy of the right submandibular gland.

## 2021-01-04 ENCOUNTER — Telehealth: Payer: Medicaid Other | Admitting: Family

## 2021-01-04 DIAGNOSIS — J029 Acute pharyngitis, unspecified: Secondary | ICD-10-CM

## 2021-01-04 MED ORDER — AMOXICILLIN-POT CLAVULANATE 875-125 MG PO TABS: 1.0000 | ORAL_TABLET | Freq: Two times a day (BID) | ORAL | 0 refills | Status: DC

## 2021-01-04 NOTE — Progress Notes (Signed)
Virtual Visit Consent   [name], you are scheduled for a virtual visit with a [facility] provider today.     Just as with appointments in the office, your consent must be obtained to participate.  Your consent will be active for this visit and any virtual visit you may have with one of our providers in the next 365 days.     If you have a MyChart account, a copy of this consent can be sent to you electronically.  All virtual visits are billed to your insurance company just like a traditional visit in the office.    As this is a virtual visit, video technology does not allow for your provider to perform a traditional examination.  This may limit your provider's ability to fully assess your condition.  If your provider identifies any concerns that need to be evaluated in person or the need to arrange testing (such as labs, EKG, etc.), we will make arrangements to do so.     Although advances in technology are sophisticated, we cannot ensure that it will always work on either your end or our end.  If the connection with a video visit is poor, the visit may have to be switched to a telephone visit.  With either a video or telephone visit, we are not always able to ensure that we have a secure connection.     I need to obtain your verbal consent now.   Are you willing to proceed with your visit today?    [name] has provided verbal consent on 01/04/2021 for a virtual visit (video or telephone).   Jannifer Rodney, FNP   Date: 01/04/2021 7:21 PM   Virtual Visit via Video Note   I, Jannifer Rodney, connected with  [name]  ([id number], Aug 02, 1980) on 01/04/21 at  7:15 PM EST by a video-enabled telemedicine application and verified that I am speaking with the correct person using two identifiers.  Location: Patient: Virtual Visit Location Patient: Home Provider: Virtual Visit Location Provider: Home Office   I discussed the limitations of evaluation and management by telemedicine and  the availability of in person appointments. The patient expressed understanding and agreed to proceed.    History of Present Illness: [name] is a 40 y.o. who identifies as a female who was assigned female at birth, and is being seen today for sore throat.  HPI: Sore Throat  This is a new problem. The current episode started in the past 7 days. The problem has been gradually worsening. The maximum temperature recorded prior to her arrival was 100.4 - 100.9 F. The pain is at a severity of 5/10. The pain is moderate. Associated symptoms include congestion, coughing, ear pain, headaches, a hoarse voice, swollen glands and trouble swallowing. Pertinent negatives include no ear discharge or shortness of breath. She has tried acetaminophen for the symptoms. The treatment provided mild relief.   Problems:  Patient Active Problem List   Diagnosis Date Noted   Sore throat    Cough    Neck abscess 01/19/2020   Diarrhea 01/19/2020   Hypertension    Hypokalemia    Tobacco abuse    Group A streptococcal infection    Acute pyelonephritis 06/21/2018    Allergies:  Allergies  Allergen Reactions   Hydrocodone Itching and Other (See Comments)    Headache    Tramadol Itching   Medications:  Current Outpatient Medications:    amoxicillin-clavulanate (AUGMENTIN) 875-125 MG tablet, Take 1 tablet  by mouth 2 (two) times daily., Disp: 14 tablet, Rfl: 0   amLODipine (NORVASC) 5 MG tablet, Take 1 tablet (5 mg total) by mouth daily., Disp: 30 tablet, Rfl: 0   dexamethasone (DECADRON) 4 MG tablet, Take 1 tablet (4 mg total) by mouth 3 (three) times daily., Disp: 12 tablet, Rfl: 0  Observations/Objective: Patient is well-developed, well-nourished in no acute distress.  Resting comfortably  at home.  Head is normocephalic, atraumatic.  No labored breathing.  Speech is clear and coherent with logical content.  Patient is alert and oriented at baseline.  Swollen glands in right throat  Assessment and  Plan: 1. Acute pharyngitis, unspecified etiology - amoxicillin-clavulanate (AUGMENTIN) 875-125 MG tablet; Take 1 tablet by mouth 2 (two) times daily.  Dispense: 14 tablet; Refill: 0 - Take meds as prescribed - Use a cool mist humidifier  -Use saline nose sprays frequently -Force fluids -For any cough or congestion  Use plain Mucinex- regular strength or max strength is fine -For fever or aces or pains- take tylenol or ibuprofen. -Throat lozenges if help -New toothbrush in 3 days If not improved in 24 hours needs to be seen face to face  Follow Up Instructions: I discussed the assessment and treatment plan with the patient. The patient was provided an opportunity to ask questions and all were answered. The patient agreed with the plan and demonstrated an understanding of the instructions.  A copy of instructions were sent to the patient via MyChart unless otherwise noted below.     The patient was advised to call back or seek an in-person evaluation if the symptoms worsen or if the condition fails to improve as anticipated.  Time:  I spent 6 minutes with the patient via telehealth technology discussing the above problems/concerns.    Jannifer Rodney, FNP

## 2021-01-14 ENCOUNTER — Telehealth: Payer: Medicaid Other | Admitting: Physician Assistant

## 2021-01-14 DIAGNOSIS — B3731 Acute candidiasis of vulva and vagina: Secondary | ICD-10-CM | POA: Diagnosis not present

## 2021-01-15 ENCOUNTER — Encounter: Payer: Self-pay | Admitting: Physician Assistant

## 2021-01-15 ENCOUNTER — Emergency Department
Admission: EM | Admit: 2021-01-15 | Discharge: 2021-01-15 | Disposition: A | Discharge status: Home / Self Care | Payer: Medicaid Other | Attending: Emergency Medicine | Admitting: Emergency Medicine

## 2021-01-15 ENCOUNTER — Other Ambulatory Visit: Payer: Self-pay

## 2021-01-15 ENCOUNTER — Telehealth: Payer: Medicaid Other | Admitting: Physician Assistant

## 2021-01-15 DIAGNOSIS — R591 Generalized enlarged lymph nodes: Secondary | ICD-10-CM | POA: Diagnosis not present

## 2021-01-15 DIAGNOSIS — I10 Essential (primary) hypertension: Secondary | ICD-10-CM | POA: Diagnosis not present

## 2021-01-15 DIAGNOSIS — J020 Streptococcal pharyngitis: Secondary | ICD-10-CM | POA: Diagnosis not present

## 2021-01-15 DIAGNOSIS — F17210 Nicotine dependence, cigarettes, uncomplicated: Secondary | ICD-10-CM | POA: Insufficient documentation

## 2021-01-15 DIAGNOSIS — Z79899 Other long term (current) drug therapy: Secondary | ICD-10-CM | POA: Insufficient documentation

## 2021-01-15 DIAGNOSIS — J029 Acute pharyngitis, unspecified: Secondary | ICD-10-CM | POA: Diagnosis not present

## 2021-01-15 LAB — CBC WITH DIFFERENTIAL/PLATELET
Abs Immature Granulocytes: 0.1 10*3/uL — ABNORMAL HIGH (ref 0.00–0.07)
Basophils Absolute: 0.1 10*3/uL (ref 0.0–0.1)
Basophils Relative: 0 %
Eosinophils Absolute: 0 10*3/uL (ref 0.0–0.5)
Eosinophils Relative: 0 %
HCT: 36.3 % (ref 36.0–46.0)
Hemoglobin: 12.4 g/dL (ref 12.0–15.0)
Immature Granulocytes: 1 %
Lymphocytes Relative: 8 %
Lymphs Abs: 1.5 10*3/uL (ref 0.7–4.0)
MCH: 30.6 pg (ref 26.0–34.0)
MCHC: 34.2 g/dL (ref 30.0–36.0)
MCV: 89.6 fL (ref 80.0–100.0)
Monocytes Absolute: 1.3 10*3/uL — ABNORMAL HIGH (ref 0.1–1.0)
Monocytes Relative: 6 %
Neutro Abs: 17.6 10*3/uL — ABNORMAL HIGH (ref 1.7–7.7)
Neutrophils Relative %: 85 %
Platelets: 263 10*3/uL (ref 150–400)
RBC: 4.05 MIL/uL (ref 3.87–5.11)
RDW: 13.3 % (ref 11.5–15.5)
WBC: 20.6 10*3/uL — ABNORMAL HIGH (ref 4.0–10.5)
nRBC: 0 % (ref 0.0–0.2)

## 2021-01-15 LAB — GROUP A STREP BY PCR: Group A Strep by PCR: DETECTED — AB

## 2021-01-15 LAB — MONONUCLEOSIS SCREEN: Mono Screen: NEGATIVE

## 2021-01-15 MED ORDER — HYDROCODONE-ACETAMINOPHEN 5-325 MG PO TABS
1.0000 | ORAL_TABLET | Freq: Once | ORAL | Status: AC
  Administered 2021-01-15: 1 via ORAL
  Filled 2021-01-15: qty 1

## 2021-01-15 MED ORDER — FLUCONAZOLE 150 MG PO TABS: 150.0000 mg | ORAL_TABLET | Freq: Once | ORAL | 0 refills | Status: AC

## 2021-01-15 MED ORDER — HYDROCODONE-ACETAMINOPHEN 5-325 MG PO TABS: 1.0000 | ORAL_TABLET | Freq: Three times a day (TID) | ORAL | 0 refills | Status: AC | PRN

## 2021-01-15 MED ORDER — LIDOCAINE VISCOUS HCL 2 % MT SOLN
15.0000 mL | Freq: Once | OROMUCOSAL | Status: AC
  Administered 2021-01-15: 15 mL via OROMUCOSAL
  Filled 2021-01-15: qty 15

## 2021-01-15 MED ORDER — HYDROCODONE-ACETAMINOPHEN 5-325 MG PO TABS: 1.0000 | ORAL_TABLET | Freq: Three times a day (TID) | ORAL | 0 refills | Status: DC | PRN

## 2021-01-15 MED ORDER — PENICILLIN G BENZATHINE 1200000 UNIT/2ML IM SUSY
1.2000 10*6.[IU] | PREFILLED_SYRINGE | Freq: Once | INTRAMUSCULAR | Status: AC
  Administered 2021-01-15: 1.2 10*6.[IU] via INTRAMUSCULAR
  Filled 2021-01-15: qty 2

## 2021-01-15 NOTE — ED Provider Notes (Signed)
[facility] Emergency Department Provider Note ____________________________________________  Time seen: 1151  I have reviewed the triage vital signs and the nursing notes.  HISTORY  Chief Complaint  Sore Throat   HPI [name] is a 40 y.o. female presents to the ED with 1 week complaint of sore throat.  Patient had an E-visit after onset of symptoms on 11/11, and was started empirically on Augmentin.  She presents today with continued sore throat pain in which she reports a swelling to the bilateral aspects of the neck.  She reports symptoms have not resolved since starting on antibiotics.  She denies any interim fever, chills, sweats [place] denies difficulty breathing, swallowing, or controlling oral secretions.  Of note, the patient had an admission this time last year for a PTA that was I indeed in the ED by ENT.  Past Medical History:  Diagnosis Date   Hypertension     Patient Active Problem List   Diagnosis Date Noted   Sore throat    Cough    Neck abscess 01/19/2020   Diarrhea 01/19/2020   Hypertension    Hypokalemia    Tobacco abuse    Group A streptococcal infection    Acute pyelonephritis 06/21/2018    Past Surgical History:  Procedure Laterality Date   CESAREAN SECTION     LAPAROSCOPIC TUBAL LIGATION Bilateral 01/26/2015   Procedure: LAPAROSCOPIC BILATERAL TUBAL LIGATION;  Surgeon: Tereso Newcomer, MD;  Location: [facility];  Service: Gynecology;  Laterality: Bilateral;   WISDOM TOOTH EXTRACTION      Prior to Admission medications   Medication Sig Start Date End Date Taking? Authorizing Provider  amLODipine (NORVASC) 5 MG tablet Take 1 tablet (5 mg total) by mouth daily. 01/21/20 02/20/20  Delfino Lovett, MD  fluconazole (DIFLUCAN) 150 MG tablet Take 1 tablet (150 mg total) by mouth once for 1 dose. 01/15/21 01/15/21  Margaretann Loveless, PA-C  HYDROcodone-acetaminophen (NORCO) 5-325 MG tablet Take 1 tablet by mouth 3 (three) times daily as  needed for up to 3 days. 01/15/21 01/18/21  Julian Askin, Charlesetta Ivory, PA-C    Allergies Hydrocodone and Tramadol  Family History  Problem Relation Age of Onset   Hypertension Mother    Diabetes Mother    Hyperlipidemia Mother    Heart disease Mother     Social History Social History   Tobacco Use   Smoking status: Every Day    Packs/day: 0.50    Years: 15.00    Pack years: 7.50    Types: Cigarettes   Smokeless tobacco: Never  Vaping Use   Vaping Use: Never used  Substance Use Topics   Alcohol use: Yes    Alcohol/week: 1.0 standard drink    Types: 1 Glasses of wine per week    Comment: occasionally    Drug use: Yes    Types: Marijuana    Review of Systems  Constitutional: Negative for fever. Eyes: Negative for visual changes. ENT: Positive for sore throat. Cardiovascular: Negative for chest pain. Respiratory: Negative for shortness of breath. Gastrointestinal: Negative for abdominal pain, vomiting and diarrhea. Genitourinary: Negative for dysuria. Musculoskeletal: Negative for back pain. Skin: Negative for rash. Neurological: Negative for headaches, focal weakness or numbness. ____________________________________________  PHYSICAL EXAM:  VITAL SIGNS: ED Triage Vitals  Enc Vitals Group     BP 01/15/21 1142 (!) 139/99     Pulse Rate 01/15/21 1142 (!) 105     Resp 01/15/21 1142 20     Temp 01/15/21 1142  98.5 F (36.9 C)     Temp Source 01/15/21 1142 Oral     SpO2 01/15/21 1142 100 %     Weight 01/15/21 1101 140 lb (63.5 kg)     Height 01/15/21 1101 5\' 4"  (1.626 m)     Head Circumference --      Peak Flow --      Pain Score 01/15/21 1101 7     Pain Loc --      Pain Edu? --      Excl. in GC? --     Constitutional: Alert and oriented. Well appearing and in no distress. Head: Normocephalic and atraumatic. Eyes: Conjunctivae are normal. PERRL. Normal extraocular movements Ears: Canals clear. TMs intact bilaterally. Nose: No  congestion/rhinorrhea/epistaxis. Mouth/Throat: Mucous membranes are moist.  Uvula is midline and tonsils are large without significant exudates but are erythematous.  No midline shift or airway compromise is appreciated. Neck: Supple. No thyromegaly. Hematological/Lymphatic/Immunological: Palpable anterior cervical lymphadenopathy. Cardiovascular: Normal rate, regular rhythm. Normal distal pulses. Respiratory: Normal respiratory effort. No wheezes/rales/rhonchi. Gastrointestinal: Soft and nontender. No distention. Musculoskeletal: Nontender with normal range of motion in all extremities.  Neurologic:  Normal gait without ataxia. Normal speech and language. No gross focal neurologic deficits are appreciated. Skin:  Skin is warm, dry and intact. No rash noted. ____________________________________________    {LABS (pertinent positives/negatives)  Labs Reviewed  GROUP A STREP BY PCR - Abnormal; Notable for the following components:      Result Value   Group A Strep by PCR DETECTED (*)    All other components within normal limits  CBC WITH DIFFERENTIAL/PLATELET - Abnormal; Notable for the following components:   WBC 20.6 (*)    Neutro Abs 17.6 (*)    Monocytes Absolute 1.3 (*)    Abs Immature Granulocytes 0.10 (*)    All other components within normal limits  MONONUCLEOSIS SCREEN  BASIC METABOLIC PANEL  ____________________________________________  {EKG  ____________________________________________   RADIOLOGY Official radiology report(s): No results found. ____________________________________________  PROCEDURES  Penicillin G benzathine 1.2 millions units IM Viscous lido 2% gargle Norco 5-325 mg PO   Procedures ____________________________________________   INITIAL IMPRESSION / ASSESSMENT AND PLAN / ED COURSE  As part of my medical decision making, I reviewed the following data within the electronic MEDICAL RECORD NUMBER Labs reviewed as noted and Notes from prior ED  visits   DDX: tonsillitis, pharyngitis, mono, PTA  Patient with ED evaluation of a weeks worth of sore throat complaint with associated malaise.  She had a E-visit with her provider was started empirically on amoxicillin which she completed 2 days ago.  She presents today due to ongoing symptoms and no benefit with antibiotic.  She was found to have a positive strep PCR today with an elevated white count of 20,000.  Patient otherwise without signs of acute PTA or uvular shift.  She is able to control oral secretions and hasno airway compromise at this time.  Patient appears stable for outpatient management.  An empiric dose of [place] was given IM in the ED and she was discharged with pain medicines.  Strict return precautions have been reviewed.  She will follow-up with his ED or [facility] for ongoing symptoms.   Keaunna D Pomerleau was evaluated in Emergency Department on 01/15/2021 for the symptoms described in the history of present illness. She was evaluated in the context of the global COVID-19 pandemic, which necessitated consideration that the patient might be at risk for infection with the SARS-CoV-2 virus  that causes COVID-19. Institutional protocols and algorithms that pertain to the evaluation of patients at risk for COVID-19 are in a state of rapid change based on information released by regulatory bodies including the CDC and federal and state organizations. These policies and algorithms were followed during the patient's care in the ED.  I reviewed the patient's prescription history over the last 12 months in the multi-state controlled substances database(s) that includes [place], [place], [place], [place], [place], [place], [place], [place], [place], [place], [place], [place], [place], and [place].  Results were notable for no current RX.  ____________________________________________  FINAL CLINICAL IMPRESSION(S) / ED DIAGNOSES  Final diagnoses:  Strep  throat      Karmen Stabs, Charlesetta Ivory, PA-C 01/15/21 1433    Jene Every, MD 01/15/21 1515

## 2021-01-15 NOTE — Discharge Instructions (Addendum)
Your strep test was negative even treated with a single dose of penicillin provided in the ED.  Take the pain medicine as as directed.  Continue with soft foods and fluids to help prevent dehydration.  May follow-up with your provider or [facility] for ongoing symptoms.  Return to the ED for difficulty breathing, swallowing, or controlling your spit.

## 2021-01-15 NOTE — Progress Notes (Signed)

## 2021-01-15 NOTE — Patient Instructions (Signed)
  [name], thank you for joining Margaretann Loveless, PA-C for today's virtual visit.  While this provider is not your primary care provider (PCP), if your PCP is located in our provider database this encounter information will be shared with them immediately following your visit.  Consent: (Patient) [name] provided verbal consent for this virtual visit at the beginning of the encounter.  Current Medications:  Current Outpatient Medications:    amLODipine (NORVASC) 5 MG tablet, Take 1 tablet (5 mg total) by mouth daily., Disp: 30 tablet, Rfl: 0   amoxicillin-clavulanate (AUGMENTIN) 875-125 MG tablet, Take 1 tablet by mouth 2 (two) times daily., Disp: 14 tablet, Rfl: 0   dexamethasone (DECADRON) 4 MG tablet, Take 1 tablet (4 mg total) by mouth 3 (three) times daily., Disp: 12 tablet, Rfl: 0   fluconazole (DIFLUCAN) 150 MG tablet, Take 1 tablet (150 mg total) by mouth once for 1 dose., Disp: 1 tablet, Rfl: 0   Medications ordered in this encounter:  No orders of the defined types were placed in this encounter.    *If you need refills on other medications prior to your next appointment, please contact your pharmacy*  Follow-Up: Call back or seek an in-person evaluation if the symptoms worsen or if the condition fails to improve as anticipated.  Other Instructions Seek further evaluation in person   If you have been instructed to have an in-person evaluation today at a local Urgent Care facility, please use the link below. It will take you to a list of all of our available [facility], including address, phone number and hours of operation. Please do not delay care.  [facility]  If you or a family member do not have a primary care provider, use the link below to schedule a visit and establish care. When you choose a [facility] primary care physician or advanced practice provider, you gain a long-term partner in health. Find a Primary Care  Provider  Learn more about [facility]'s in-office and virtual care options: [facility] - Get Care Now

## 2021-01-15 NOTE — ED Notes (Signed)
Rn to bedside. Pt states "my tonsils are supposed to come out last year but I chose not to have the surgery". Pt in no acute distress. Pt has apt with ENT in December to reschedule surgery to remove tonsils. Pt was recently on antibiotics last week for same symptoms.

## 2021-01-15 NOTE — ED Triage Notes (Addendum)
Pt in with co sore throat and swelling to neck bilat. States had e-visit where they put her on antibiotics for the same. States symptoms have not resolved. Symptoms started 01/08/21

## 2021-01-15 NOTE — Progress Notes (Signed)
Virtual Visit Consent   [name], you are scheduled for a virtual visit with a [facility] provider today.     Just as with appointments in the office, your consent must be obtained to participate.  Your consent will be active for this visit and any virtual visit you may have with one of our providers in the next 365 days.     If you have a MyChart account, a copy of this consent can be sent to you electronically.  All virtual visits are billed to your insurance company just like a traditional visit in the office.    As this is a virtual visit, video technology does not allow for your provider to perform a traditional examination.  This may limit your provider's ability to fully assess your condition.  If your provider identifies any concerns that need to be evaluated in person or the need to arrange testing (such as labs, EKG, etc.), we will make arrangements to do so.     Although advances in technology are sophisticated, we cannot ensure that it will always work on either your end or our end.  If the connection with a video visit is poor, the visit may have to be switched to a telephone visit.  With either a video or telephone visit, we are not always able to ensure that we have a secure connection.     I need to obtain your verbal consent now.   Are you willing to proceed with your visit today?    [name] has provided verbal consent on 01/15/2021 for a virtual visit (video or telephone).   Margaretann Loveless, PA-C   Date: 01/15/2021 10:15 AM   Virtual Visit via Video Note   I, Margaretann Loveless, connected with  [name]  ([id number], 1981-01-31) on 01/15/21 at 10:00 AM EST by a video-enabled telemedicine application and verified that I am speaking with the correct person using two identifiers.  Location: Patient: Virtual Visit Location Patient: Home Provider: Virtual Visit Location Provider: Mobile   I discussed the limitations of evaluation and management by  telemedicine and the availability of in person appointments. The patient expressed understanding and agreed to proceed.    History of Present Illness: [name] is a 40 y.o. who identifies as a female who was assigned female at birth, and is being seen today for continued sore throat and swelling in the neck. She was seen and treated with Augmentin on 01/04/21. She has completed treatment and still continues to have pain in the throat with swallowing and a swollen, tender lump on the right side of the neck just under the jaw. She has had similar issue before with an abscess and had to be hospitalized in 12/2019 for same issue. She does have an appt with ENT on 02/18/21.    Problems:  Patient Active Problem List   Diagnosis Date Noted   Sore throat    Cough    Neck abscess 01/19/2020   Diarrhea 01/19/2020   Hypertension    Hypokalemia    Tobacco abuse    Group A streptococcal infection    Acute pyelonephritis 06/21/2018    Allergies:  Allergies  Allergen Reactions   Hydrocodone Itching and Other (See Comments)    Headache    Tramadol Itching   Medications:  Current Outpatient Medications:    amLODipine (NORVASC) 5 MG tablet, Take 1 tablet (5 mg total) by mouth daily., Disp: 30 tablet, Rfl: 0  amoxicillin-clavulanate (AUGMENTIN) 875-125 MG tablet, Take 1 tablet by mouth 2 (two) times daily., Disp: 14 tablet, Rfl: 0   dexamethasone (DECADRON) 4 MG tablet, Take 1 tablet (4 mg total) by mouth 3 (three) times daily., Disp: 12 tablet, Rfl: 0   fluconazole (DIFLUCAN) 150 MG tablet, Take 1 tablet (150 mg total) by mouth once for 1 dose., Disp: 1 tablet, Rfl: 0  Observations/Objective: Patient is well-developed, well-nourished in no acute distress.  Resting comfortably  Head is normocephalic, atraumatic.  No labored breathing.  Speech is clear and coherent with logical content.  Patient is alert and oriented at baseline.  Swollen submandibular and superior anterior chain just  anterior to SCM muscle on right appear swollen  Assessment and Plan: 1. Acute pharyngitis, unspecified etiology  2. Lymphadenopathy  - Failed Augmentin - Continues to have swelling in the neck and sore throat with history of being admitted for throat abscess - Advised to seek further evaluation in person at urgent care or ER  Follow Up Instructions: I discussed the assessment and treatment plan with the patient. The patient was provided an opportunity to ask questions and all were answered. The patient agreed with the plan and demonstrated an understanding of the instructions.  A copy of instructions were sent to the patient via MyChart unless otherwise noted below.    The patient was advised to call back or seek an in-person evaluation if the symptoms worsen or if the condition fails to improve as anticipated.  Time:  I spent 10 minutes with the patient via telehealth technology discussing the above problems/concerns.    Margaretann Loveless, PA-C

## 2021-03-16 ENCOUNTER — Encounter: Payer: Self-pay | Admitting: Otolaryngology

## 2021-03-26 NOTE — Discharge Instructions (Signed)
T & A INSTRUCTION SHEET - [facility]  Carloyn Manner, MD  [address] [place], [place] [address] TEL.  [phone]  INFORMATION SHEET FOR A TONSILLECTOMY AND ADENDOIDECTOMY  About Your Tonsils and Adenoids  The tonsils and adenoids are normal body tissues that are part of our immune system.  They normally help to protect [place] against diseases that may enter our mouth and nose. However, sometimes the tonsils and/or adenoids become too large and obstruct our breathing, especially at night.    If either of these things happen it helps to remove the tonsils and adenoids in order to become healthier. The operation to remove the tonsils and adenoids is called a tonsillectomy and adenoidectomy.  The Location of Your Tonsils and Adenoids  The tonsils are located in the back of the throat on both side and sit in a cradle of muscles. The adenoids are located in the roof of the mouth, behind the nose, and closely associated with the opening of the Eustachian tube to the ear.  Surgery on Tonsils and Adenoids  A tonsillectomy and adenoidectomy is a short operation which takes about thirty minutes.  This includes being put to sleep and being awakened. Tonsillectomies and adenoidectomies are performed at [facility] and may require observation period in the recovery room prior to going home. Children are required to remain in recovery for at least 45 minutes.   Following the Operation for a Tonsillectomy  A cautery machine is used to control bleeding. Bleeding from a tonsillectomy and adenoidectomy is minimal and postoperatively the risk of bleeding is approximately four percent, although this rarely life threatening.  After your tonsillectomy and adenoidectomy post-op care at home: 1. Our patients are able to go home the same day. You may be given prescriptions for pain medications, if indicated. 2. It is extremely important to  remember that fluid intake is of utmost importance after a tonsillectomy. The amount that you drink must be maintained in the postoperative period. A good indication of whether a child is getting enough fluid is whether his/her urine output is constant. As long as children are urinating or wetting their diaper every 6 - 8 hours this is usually enough fluid intake.   3. Although rare, this is a risk of some bleeding in the first ten days after surgery. This usually occurs between day five and nine postoperatively. This risk of bleeding is approximately four percent. If you or your child should have any bleeding you should remain calm and notify our office or go directly to the emergency room at [facility] where they will contact us. Our doctors are available seven days a week for notification. We recommend sitting up quietly in a chair, place an ice pack on the front of the neck and spitting out the blood gently until we are able to contact you. Adults should gargle gently with ice water and this may help stop the bleeding. If the bleeding does not stop after a short time, i.e. 10 to 15 minutes, or seems to be increasing again, please contact us or go to the hospital.   4. It is common for the pain to be worse at 5 - 7 days postoperatively. This occurs because the scab is peeling off and the mucous membrane (skin of the throat) is growing back where the tonsils were.   5. It is common for a low-grade fever, less than 102, during the first week  after a tonsillectomy and adenoidectomy. It is usually due to not drinking enough liquids, and we suggest your use liquid Tylenol (acetaminophen) or the pain medicine with Tylenol (acetaminophen) prescribed in order to keep your temperature below 102. Please follow the directions on the back of the bottle. °6. Recommendations for post-operative pain in children and adults: °a) For Children 12 and younger: Recommendations are for oral Tylenol  (acetaminophen) and oral Motrin (Ibuprofen) along with a prescription dose of Prednisolone which is a steroid to help with pain and swelling. Administer the Tylenol (acetaminophen) and Motrin as stated on bottle for patient's age/weight. Sometimes it may be necessary to alternate the Tylenol (acetaminophen) and Motrin for improved pain control. Motrin does last slightly longer so many patients benefit from being given this prior to bedtime. All children should avoid Aspirin products for 2 weeks following surgery. °b) For children over the age of 12: Tylenol (acetaminophen) is the preferred first choice for pain control. Depending on your child's size, sometimes they will be given a combination of Tylenol (acetaminophen) and hydrocodone medication or sometimes it will be recommended they take Motrin (ibuprofen) in addition to the Tylenol (acetaminophen). Narcotics should always be used with caution in children following surgery as they can suppress their breathing and switching to over the counter Tylenol (acetaminophen) and Motrin (ibuprofen) as soon as possible is recommended. All patients should avoid Aspirin products for 2 weeks following surgery. °c) Adults: Usually adults will require a narcotic pain medication following a tonsillectomy. This usually has either hydrocodone or oxycodone in it and can usually be taken every 4 to 6 hours as needed for moderate pain. If the medication does not have Tylenol (acetaminophen) in it, you may also supplement Tylenol (acetaminophen) as needed every 4 to 6 hours for breakthrough or mild pain. Adults are also given Viscous Lidocaine to swish and spit every 6 hours to help with topical pain. Adults should avoid Aspirin, Aleve, Motrin, and Ibuprofen products for 2 weeks following surgery as they can increase your risk of bleeding. °7. If you happen to look in the mirror or into your child's mouth you will see white/gray patches on the back of the throat. This is what a scab  looks like in the mouth and is normal after having a tonsillectomy and adenoidectomy. They will disappear once the tonsil areas heal completely. However, it may cause a noticeable odor, and this too will disappear with time.     °8. You or your child may experience ear pain after having a tonsillectomy and adenoidectomy.  This is called referred pain and comes from the throat, but it is felt in the ears.  Ear pain is quite common and expected. It will usually go away after ten days. There is usually nothing wrong with the ears, and it is primarily due to the healing area stimulating the nerve to the ear that runs along the side of the throat. Use either the prescribed pain medicine or Tylenol (acetaminophen) as needed.  °9. The throat tissues after a tonsillectomy are obviously sensitive. Smoking around children who have had a tonsillectomy significantly increases the risk of bleeding. DO NOT SMOKE! ° °What to Expect Each Day  °First Day at Home °1. Patients will be discharged home the same day.  °2. Drink at least four glasses of liquid a day. Clear, cool liquids are recommended. Fruit juices containing citric acid are not recommended because they tend to cause pain. Carbonated beverages are allowed if you pour them from glass   to glass to remove the bubbles as these tend to cause discomfort. Avoid alcoholic beverages.  3. Eat very soft foods such as soups, broth, jello, custard, pudding, ice cream, popsicles, applesauce, mashed potatoes, and in general anything that you can crush between your tongue and the roof of your mouth. Try adding Carnation Instant Breakfast Mix into your food for extra calories. It is not uncommon to lose 5 to 10 pounds of fluid weight. The weight will be gained back quickly once you're feeling better and drinking more.  4. Sleep with your head elevated on two pillows for about three days to help decrease the swelling.  5. DO NOT SMOKE!  Day Two  1. Rest as much as possible. Use common  sense in your activities.  2. Continue drinking at least four glasses of liquid per day.  3. Follow the soft diet.  4. Use your pain medication as needed.  Day Three  1. Advance your activity as you are able and continue to follow the previous day's suggestions.  Days Four Through Six  1. Advance your diet and begin to eat more solid foods such as chopped hamburger. 2. Advance your activities slowly. Children should be kept mostly around the house.  3. Not uncommonly, there will be more pain at this time. It is temporary, usually lasting a day or two.  Day Seven Through Ten  1. Most individuals by this time are able to return to work or school unless otherwise instructed. Consider sending children back to school for a half day on the first day back.  

## 2021-03-31 ENCOUNTER — Ambulatory Visit
Admission: RE | Admit: 2021-03-31 | Discharge: 2021-03-31 | Disposition: A | Discharge status: Home / Self Care | Payer: Medicaid Other | Attending: Otolaryngology | Admitting: Otolaryngology

## 2021-03-31 ENCOUNTER — Ambulatory Visit: Payer: Medicaid Other | Admitting: Anesthesiology

## 2021-03-31 ENCOUNTER — Encounter: Payer: Self-pay | Admitting: Otolaryngology

## 2021-03-31 ENCOUNTER — Encounter
Admission: RE | Disposition: A | Discharge status: Home / Self Care | Payer: Self-pay | Source: Home / Self Care | Attending: Otolaryngology

## 2021-03-31 DIAGNOSIS — J3501 Chronic tonsillitis: Secondary | ICD-10-CM | POA: Insufficient documentation

## 2021-03-31 DIAGNOSIS — F17200 Nicotine dependence, unspecified, uncomplicated: Secondary | ICD-10-CM | POA: Diagnosis not present

## 2021-03-31 HISTORY — PX: TONSILLECTOMY: SHX5217

## 2021-03-31 LAB — POCT PREGNANCY, URINE: Preg Test, Ur: NEGATIVE

## 2021-03-31 SURGERY — TONSILLECTOMY
Anesthesia: General | Site: Throat | Laterality: Bilateral

## 2021-03-31 MED ORDER — SCOPOLAMINE 1 MG/3DAYS TD PT72
1.0000 | MEDICATED_PATCH | TRANSDERMAL | Status: DC
  Administered 2021-03-31: 1.5 mg via TRANSDERMAL

## 2021-03-31 MED ORDER — FENTANYL CITRATE (PF) 100 MCG/2ML IJ SOLN
INTRAMUSCULAR | Status: DC | PRN
  Administered 2021-03-31 (×4): 50 ug via INTRAVENOUS

## 2021-03-31 MED ORDER — OXYCODONE HCL 5 MG PO TABS: 5.0000 mg | ORAL_TABLET | Freq: Once | ORAL | Status: AC | PRN

## 2021-03-31 MED ORDER — FENTANYL CITRATE PF 50 MCG/ML IJ SOSY
25.0000 ug | PREFILLED_SYRINGE | INTRAMUSCULAR | Status: DC | PRN
  Administered 2021-03-31 (×2): 25 ug via INTRAVENOUS
  Administered 2021-03-31: 50 ug via INTRAVENOUS

## 2021-03-31 MED ORDER — OXYMETAZOLINE HCL 0.05 % NA SOLN
NASAL | Status: DC | PRN
  Administered 2021-03-31: 1 via TOPICAL

## 2021-03-31 MED ORDER — KETOROLAC TROMETHAMINE 30 MG/ML IJ SOLN: 15.0000 mg | Freq: Once | INTRAMUSCULAR | Status: DC | PRN

## 2021-03-31 MED ORDER — ONDANSETRON HCL 4 MG/2ML IJ SOLN: 4.0000 mg | Freq: Once | INTRAMUSCULAR | Status: DC | PRN

## 2021-03-31 MED ORDER — OXYCODONE HCL 5 MG/5ML PO SOLN: 10.0000 mg | Freq: Four times a day (QID) | ORAL | 0 refills | Status: DC | PRN

## 2021-03-31 MED ORDER — SUCCINYLCHOLINE CHLORIDE 200 MG/10ML IV SOSY
PREFILLED_SYRINGE | INTRAVENOUS | Status: DC | PRN
  Administered 2021-03-31: 100 mg via INTRAVENOUS

## 2021-03-31 MED ORDER — ACETAMINOPHEN 10 MG/ML IV SOLN
1000.0000 mg | Freq: Once | INTRAVENOUS | Status: AC
  Administered 2021-03-31: 1000 mg via INTRAVENOUS

## 2021-03-31 MED ORDER — SODIUM CHLORIDE 0.9 % IV SOLN: 800.0000 mg | Freq: Once | INTRAVENOUS | Status: DC

## 2021-03-31 MED ORDER — DEXAMETHASONE SODIUM PHOSPHATE 4 MG/ML IJ SOLN
INTRAMUSCULAR | Status: DC | PRN
  Administered 2021-03-31: 10 mg via INTRAVENOUS

## 2021-03-31 MED ORDER — ONDANSETRON HCL 4 MG PO TABS: 4.0000 mg | ORAL_TABLET | Freq: Three times a day (TID) | ORAL | 0 refills | Status: AC | PRN

## 2021-03-31 MED ORDER — LIDOCAINE VISCOUS HCL 2 % MT SOLN: 10.0000 mL | Freq: Four times a day (QID) | OROMUCOSAL | 0 refills | Status: AC | PRN

## 2021-03-31 MED ORDER — OXYCODONE HCL 5 MG/5ML PO SOLN
5.0000 mg | Freq: Once | ORAL | Status: AC | PRN
  Administered 2021-03-31: 5 mg via ORAL

## 2021-03-31 MED ORDER — LIDOCAINE HCL (CARDIAC) PF 100 MG/5ML IV SOSY
PREFILLED_SYRINGE | INTRAVENOUS | Status: DC | PRN
  Administered 2021-03-31: 40 mg via INTRAVENOUS

## 2021-03-31 MED ORDER — DEXMEDETOMIDINE (PRECEDEX) IN NS 20 MCG/5ML (4 MCG/ML) IV SYRINGE
PREFILLED_SYRINGE | INTRAVENOUS | Status: DC | PRN
  Administered 2021-03-31: 5 ug via INTRAVENOUS
  Administered 2021-03-31: 10 ug via INTRAVENOUS

## 2021-03-31 MED ORDER — ONDANSETRON HCL 4 MG/2ML IJ SOLN
INTRAMUSCULAR | Status: DC | PRN
  Administered 2021-03-31: 4 mg via INTRAVENOUS

## 2021-03-31 MED ORDER — LACTATED RINGERS IV SOLN: INTRAVENOUS | Status: DC

## 2021-03-31 MED ORDER — PROPOFOL 10 MG/ML IV BOLUS
INTRAVENOUS | Status: DC | PRN
  Administered 2021-03-31: 150 mg via INTRAVENOUS
  Administered 2021-03-31: 50 mg via INTRAVENOUS

## 2021-03-31 MED ORDER — MIDAZOLAM HCL 5 MG/5ML IJ SOLN
INTRAMUSCULAR | Status: DC | PRN
  Administered 2021-03-31: 2 mg via INTRAVENOUS

## 2021-03-31 MED ORDER — BUPIVACAINE HCL (PF) 0.25 % IJ SOLN
INTRAMUSCULAR | Status: DC | PRN
  Administered 2021-03-31: 2 mL

## 2021-03-31 MED ORDER — GLYCOPYRROLATE 0.2 MG/ML IJ SOLN
INTRAMUSCULAR | Status: DC | PRN
  Administered 2021-03-31: .1 mg via INTRAVENOUS

## 2021-03-31 SURGICAL SUPPLY — 18 items
BLADE BOVIE TIP EXT 4 (BLADE) ×3 IMPLANT
CANISTER SUCT 1200ML W/VALVE (MISCELLANEOUS) ×3 IMPLANT
CATH ROBINSON RED A/P 10FR (CATHETERS) ×3 IMPLANT
COAG SUCT 10F 3.5MM HAND CTRL (MISCELLANEOUS) ×3 IMPLANT
ELECT REM PT RETURN 9FT ADLT (ELECTROSURGICAL) ×2
ELECTRODE REM PT RTRN 9FT ADLT (ELECTROSURGICAL) ×2 IMPLANT
GLOVE SURG GAMMEX PI TX LF 7.5 (GLOVE) ×3 IMPLANT
HANDLE SUCTION POOLE (INSTRUMENTS) ×2 IMPLANT
KIT TURNOVER KIT A (KITS) ×3 IMPLANT
NS IRRIG 500ML POUR BTL (IV SOLUTION) ×3 IMPLANT
PACK TONSIL AND ADENOID CUSTOM (PACKS) ×3 IMPLANT
PENCIL SMOKE EVACUATOR (MISCELLANEOUS) ×3 IMPLANT
SLEEVE SUCTION 125 (MISCELLANEOUS) ×3 IMPLANT
SOL ANTI-FOG 6CC FOG-OUT (MISCELLANEOUS) ×2 IMPLANT
SOL FOG-OUT ANTI-FOG 6CC (MISCELLANEOUS) ×1
STRAP BODY AND KNEE 60X3 (MISCELLANEOUS) ×3 IMPLANT
SUCTION POOLE HANDLE (INSTRUMENTS) ×2
SYR 5ML LL (SYRINGE) ×3 IMPLANT

## 2021-03-31 NOTE — H&P (Signed)
..  History and Physical paper copy reviewed and updated date of procedure and will be scanned into system.  Patient seen and examined.  

## 2021-03-31 NOTE — Anesthesia Procedure Notes (Signed)
Procedure Name: Intubation Date/Time: 03/31/2021 9:51 AM Performed by: Mayme Genta, CRNA Pre-anesthesia Checklist: Patient identified, Emergency Drugs available, Suction available, Patient being monitored and Timeout performed Patient Re-evaluated:Patient Re-evaluated prior to induction Oxygen Delivery Method: Circle system utilized Preoxygenation: Pre-oxygenation with 100% oxygen Induction Type: IV induction Ventilation: Mask ventilation without difficulty Laryngoscope Size: Miller and 2 Grade View: Grade I Tube type: Oral Rae Tube size: 7.0 mm Number of attempts: 1 Placement Confirmation: ETT inserted through vocal cords under direct vision, positive ETCO2 and breath sounds checked- equal and bilateral Tube secured with: Tape Dental Injury: Teeth and Oropharynx as per pre-operative assessment

## 2021-03-31 NOTE — Anesthesia Preprocedure Evaluation (Signed)
Anesthesia Evaluation  Patient identified by MRN, date of birth, ID band Patient awake    Reviewed: Allergy & Precautions, H&P , NPO status , Patient's Chart, lab work & pertinent test results, reviewed documented beta blocker date and time   Airway Mallampati: I  TM Distance: >3 FB Neck ROM: full    Dental no notable dental hx. (+) Teeth Intact   Pulmonary Current Smoker,    Pulmonary exam normal breath sounds clear to auscultation       Cardiovascular Exercise Tolerance: Good hypertension, Normal cardiovascular exam Rhythm:Regular Rate:Normal     Neuro/Psych negative neurological ROS  negative psych ROS   GI/Hepatic negative GI ROS, Neg liver ROS,   Endo/Other  negative endocrine ROS  Renal/GU Renal disease  negative genitourinary   Musculoskeletal negative musculoskeletal ROS (+)   Abdominal Normal abdominal exam  (+) - obese,  Abdomen: soft.    Peds  Hematology negative hematology ROS (+)   Anesthesia Other Findings   Reproductive/Obstetrics negative OB ROS Undesired fertility                             Anesthesia Physical  Anesthesia Plan  ASA: II  Anesthesia Plan: General   Post-op Pain Management:    Induction: Intravenous  PONV Risk Score and Plan: 2 and Treatment may vary due to age or medical condition, Ondansetron and Dexamethasone  Airway Management Planned: Oral ETT  Additional Equipment:   Intra-op Plan:   Post-operative Plan: Extubation in OR  Informed Consent: I have reviewed the patients History and Physical, chart, labs and discussed the procedure including the risks, benefits and alternatives for the proposed anesthesia with the patient or authorized representative who has indicated his/her understanding and acceptance.     Dental advisory given  Plan Discussed with: CRNA, Anesthesiologist and Surgeon  Anesthesia Plan Comments:          Anesthesia Quick Evaluation  Patient Active Problem List   Diagnosis Date Noted   Sore throat    Cough    Neck abscess 01/19/2020   Diarrhea 01/19/2020   Hypertension    Hypokalemia    Tobacco abuse    Group A streptococcal infection    Acute pyelonephritis 06/21/2018    CBC Latest Ref Rng & Units 01/15/2021 01/21/2020 01/20/2020  WBC 4.0 - 10.5 K/uL 20.6(H) 7.1 10.3  Hemoglobin 12.0 - 15.0 g/dL 12.4 12.2 11.0(L)  Hematocrit 36.0 - 46.0 % 36.3 35.2(L) 32.8(L)  Platelets 150 - 400 K/uL 263 331 288   BMP Latest Ref Rng & Units 01/21/2020 01/20/2020 01/19/2020  Glucose 70 - 99 mg/dL 144(H) 104(H) 99  BUN 6 - 20 mg/dL 12 10 7   Creatinine 0.44 - 1.00 mg/dL 0.52 0.48 0.52  Sodium 135 - 145 mmol/L 138 138 136  Potassium 3.5 - 5.1 mmol/L 3.8 4.0 3.1(L)  Chloride 98 - 111 mmol/L 103 105 104  CO2 22 - 32 mmol/L 25 26 22   Calcium 8.9 - 10.3 mg/dL 9.7 9.1 9.0    Risks and benefits of anesthesia discussed at length, patient or surrogate demonstrates understanding. Appropriately NPO. Plan to proceed with anesthesia.  Champ Mungo, MD 03/31/21

## 2021-03-31 NOTE — Op Note (Signed)
..  03/31/2021  10:21 AM    [name]  WS:[id number]   Pre-Op Dx:  Chronic tonsillitis  Post-op Dx: Chronic tonsillitis  Proc:Tonsillectomy > age 42  Surg: Mehki Klumpp  Anes:  General Endotracheal  EBL:  67ml  Comp:  None  Findings:  3+ cryptic tonsils with severe fibrotic scar to underlying musculature at inferior aspect of tonsils bilaterally  Procedure: After the patient was identified in holding and the history and physical and consent was reviewed, the patient was taken to the operating room and placed in a supine position.  General endotracheal anesthesia was induced in the normal fashion.  At this time, the patient was rotated 45 degrees and a shoulder roll was placed.  At this time, a McIvor mouthgag was inserted into the patient's oral cavity and suspended from the Hamilton stand without injury to teeth, lips, or gums.  Next a red rubber catheter was inserted into the patient left nostril for retraction of the uvula and soft palate superiorly.  Next a curved Alice clamp was attached to the patient's right superior tonsillar pole and retracted medially and inferiorly.  A Bovie electrocautery was used to dissect the patient's right tonsil in a subcapsular plane.  Meticulous hemostasis was achieved with Bovie suction cautery.  At this time, the mouth gag was released from suspension for 1 minute.  Attention now was directed to the patient's left side.  In a similar fashion the curved Alice clamp was attached to the superior pole and this was retracted medially and inferiorly and the tonsil was excised in a subcapsular plane with Bovie electrocautery.  After completion of the second tonsil, meticulous hemostasis was continued.  At this time, the patient's nasal cavity and oral cavity was irrigated with sterile saline.  Two ml 0.25% Marcaine was injected into the anterior and posterior tonsillar fossa bilaterally.  Following this  The care of patient was returned to anesthesia,  awakened, and transferred to recovery in stable condition.  Dispo:  PACU to home  Plan: Soft diet.  Limit exercise and strenuous activity for 2 weeks.  Fluid hydration  Recheck my office three weeks.   Jeannie Fend [name] 10:21 AM 03/31/2021

## 2021-03-31 NOTE — Transfer of Care (Signed)
Immediate Anesthesia Transfer of Care Note  Patient: [name]  Procedure(s) Performed: TONSILLECTOMY (Bilateral: Throat)  Patient Location: PACU  Anesthesia Type: General  Level of Consciousness: awake, alert  and patient cooperative  Airway and Oxygen Therapy: Patient Spontanous Breathing and Patient connected to supplemental oxygen  Post-op Assessment: Post-op Vital signs reviewed, Patient's Cardiovascular Status Stable, Respiratory Function Stable, Patent Airway and No signs of Nausea or vomiting  Post-op Vital Signs: Reviewed and stable  Complications: No notable events documented.

## 2021-03-31 NOTE — Anesthesia Postprocedure Evaluation (Signed)
Anesthesia Post Note  Patient: [name]  Procedure(s) Performed: TONSILLECTOMY (Bilateral: Throat)     Patient location during evaluation: PACU Anesthesia Type: General Level of consciousness: awake and alert Pain management: pain level controlled Vital Signs Assessment: post-procedure vital signs reviewed and stable Respiratory status: spontaneous breathing, nonlabored ventilation, respiratory function stable and patient connected to nasal cannula oxygen Cardiovascular status: blood pressure returned to baseline and stable Postop Assessment: no apparent nausea or vomiting Anesthetic complications: no   No notable events documented.  Edwyna Ready

## 2021-04-01 ENCOUNTER — Encounter: Payer: Self-pay | Admitting: Otolaryngology

## 2021-04-02 LAB — SURGICAL PATHOLOGY

## 2021-11-23 ENCOUNTER — Other Ambulatory Visit: Payer: Self-pay

## 2021-11-23 ENCOUNTER — Emergency Department
Admission: EM | Admit: 2021-11-23 | Discharge: 2021-11-23 | Disposition: A | Discharge status: Home / Self Care | Payer: Medicaid Other | Attending: Student in an Organized Health Care Education/Training Program | Admitting: Student in an Organized Health Care Education/Training Program

## 2021-11-23 DIAGNOSIS — G510 Bell's palsy: Secondary | ICD-10-CM | POA: Diagnosis not present

## 2021-11-23 DIAGNOSIS — R200 Anesthesia of skin: Secondary | ICD-10-CM | POA: Diagnosis present

## 2021-11-23 DIAGNOSIS — E876 Hypokalemia: Secondary | ICD-10-CM | POA: Diagnosis not present

## 2021-11-23 DIAGNOSIS — I10 Essential (primary) hypertension: Secondary | ICD-10-CM | POA: Insufficient documentation

## 2021-11-23 DIAGNOSIS — Z20822 Contact with and (suspected) exposure to covid-19: Secondary | ICD-10-CM | POA: Diagnosis not present

## 2021-11-23 DIAGNOSIS — Z87891 Personal history of nicotine dependence: Secondary | ICD-10-CM | POA: Insufficient documentation

## 2021-11-23 LAB — SARS CORONAVIRUS 2 BY RT PCR: SARS Coronavirus 2 by RT PCR: NEGATIVE

## 2021-11-23 MED ORDER — PSEUDOEPHEDRINE HCL 30 MG PO TABS: 30.0000 mg | ORAL_TABLET | Freq: Four times a day (QID) | ORAL | 0 refills | Status: AC | PRN

## 2021-11-23 MED ORDER — HYPROMELLOSE (GONIOSCOPIC) 2.5 % OP SOLN: 1.0000 [drp] | Freq: Four times a day (QID) | OPHTHALMIC | 2 refills | Status: AC | PRN

## 2021-11-23 MED ORDER — OXYCODONE-ACETAMINOPHEN 5-325 MG PO TABS
1.0000 | ORAL_TABLET | Freq: Once | ORAL | Status: AC
  Administered 2021-11-23: 1 via ORAL
  Filled 2021-11-23: qty 1

## 2021-11-23 MED ORDER — PREDNISONE 10 MG PO TABS: ORAL_TABLET | ORAL | 0 refills | Status: AC

## 2021-11-23 MED ORDER — ACETAMINOPHEN 500 MG PO TABS: 1000.0000 mg | ORAL_TABLET | Freq: Once | ORAL | Status: DC

## 2021-11-23 NOTE — ED Notes (Signed)
Pt requests pain med for rt earache

## 2021-11-23 NOTE — ED Notes (Signed)
Reviewed pt's symptoms with Dr Rockne Menghini; No protocols ordered at this time

## 2021-11-23 NOTE — ED Provider Notes (Signed)
[facility] Provider Note    Event Date/Time   First MD Initiated Contact with Patient 11/23/21 [phone]     (approximate)   History   Chief Complaint Numbness   HPI [name] is a 41 y.o. female, history of hypertension, tobacco use, hypokalemia, presents the emergency department for evaluation of right-sided facial numbness x1 day.  She states that started approximately 6 hours ago.  She has also been experiencing cough/nasal congestion, and right-sided ear pain.  Additionally having watery eye on the right side.  She is unable to close her eye fully.  This has never happened to her before.  Denies chills, myalgias, numbness/tingling in upper or lower extremities, chest pain, shortness of breath, abdominal pain, flank pain, dizziness/lightheadedness, or vertigo.  History Limitations: No limitations.        Physical Exam  Triage Vital Signs: ED Triage Vitals  Enc Vitals Group     BP 11/23/21 0048 (!) 156/97     Pulse Rate 11/23/21 0048 (!) 126     Resp 11/23/21 0048 20     Temp 11/23/21 0048 99.4 F (37.4 C)     Temp Source 11/23/21 0048 Oral     SpO2 11/23/21 0048 94 %     Weight 11/23/21 0049 138 lb (62.6 kg)     Height 11/23/21 0049 5\' 4"  (1.626 m)     Head Circumference --      Peak Flow --      Pain Score 11/23/21 0049 8     Pain Loc --      Pain Edu? --      Excl. in GC? --     Most recent vital signs: Vitals:   11/23/21 0048  BP: (!) 156/97  Pulse: (!) 126  Resp: 20  Temp: 99.4 F (37.4 C)  SpO2: 94%    General: Awake, NAD.  Skin: Warm, dry. No rashes or lesions.  Eyes: PERRL. Conjunctivae normal.  CV: Good peripheral perfusion.  Resp: Normal effort.  Abd: Soft, non-tender. No distention.  Musculoskeletal: Normal ROM of all extremities.  Focused Exam: Numbness and decreased motor function on the right side of the face with mild facial droop.  She is unable to fully close her right eye or lift the right side of her  forehead.  5/5 strength and sensation in upper and lower extremities.  No ataxia.  Normal finger-nose testing.  Right ear shows mild bulging.  No erythema or obvious signs of infection.  Physical Exam    ED Results / Procedures / Treatments  Labs (all labs ordered are listed, but only abnormal results are displayed) Labs Reviewed  SARS CORONAVIRUS 2 BY RT PCR     EKG N/A.    RADIOLOGY  ED Provider Interpretation: N/A.  No results found.  PROCEDURES:  Critical Care performed: N/A.  Procedures    MEDICATIONS ORDERED IN ED: Medications  oxyCODONE-acetaminophen (PERCOCET/ROXICET) 5-325 MG per tablet 1 tablet (1 tablet Oral Given 11/23/21 0200)     IMPRESSION / MDM / ASSESSMENT AND PLAN / ED COURSE  I reviewed the triage vital signs and the nursing notes.                              Differential diagnosis includes, but is not limited to, Bell palsy, CVA, viral URI, otitis media.   Assessment/Plan Presentation consistent with Bell's palsy, likely secondary to viral URI.  Negative for COVID-19.  No motor deficits in the upper or lower extremities.  No vertigo.  Low suspicion for CVA. She appears well clinically.  Low suspicion for any serious viral infection requiring antivirals.    We will provide her with prescription for prednisone, artificial tears, and pseudoephedrine to manage her symptoms.  Recommend that she follow-up with her primary care provider within the next week to ensure no worsening in her symptoms.  Will discharge.  Provided the patient with anticipatory guidance, return precautions, and educational material. Encouraged the patient to return to the emergency department at any time if they begin to experience any new or worsening symptoms. Patient expressed understanding and agreed with the plan.   Patient's presentation is most consistent with acute complicated illness / injury requiring diagnostic workup.       FINAL CLINICAL IMPRESSION(S) / ED  DIAGNOSES   Final diagnoses:  Bell palsy     Rx / DC Orders   ED Discharge Orders          Ordered    predniSONE (DELTASONE) 10 MG tablet  Q breakfast        11/23/21 0729    hydroxypropyl methylcellulose / hypromellose (ISOPTO TEARS / GONIOVISC) 2.5 % ophthalmic solution  4 times daily PRN        11/23/21 0729    pseudoephedrine (SUDAFED) 30 MG tablet  Every 6 hours PRN        11/23/21 0729             Note:  This document was prepared using Dragon voice recognition software and may include unintentional dictation errors.   Teodoro Spray, [place] 11/23/21 4431    Merlyn Lot, MD 11/23/21 [phone]

## 2021-11-23 NOTE — ED Notes (Signed)
See triage note  Presents with right sided facial numbness  Unable to close right eye

## 2021-11-23 NOTE — Discharge Instructions (Addendum)
-  Please take the full course of your prednisone as prescribed.  You may additionally take the pseudoephedrine as needed for your sinus congestion.   -I recommend keeping your eyes moist to avoid any corneal injury as you are unable to protect the eye with your eyelids.  Utilize the artificial tears, as well as sunglasses when able.  You may also tape your eyelid down to keep it closed.  -Please follow-up with your primary care provider within the next week to ensure no worsening in symptoms.  -Return to the emergency department anytime if you begin to experience any new or worsening symptoms.

## 2021-11-23 NOTE — ED Triage Notes (Signed)
Pt states that she started to have an earache 24 hours ago on the right side and then pt developed right side facial numbness and right eye watering 6 hours ago, pt states she is unable to close right eye.Pt denies extremity weakness. Pt speech clear

## 2021-11-23 NOTE — ED Triage Notes (Addendum)
Patient ambulatory to triage with steady gait, without difficulty or distress noted; pt reports yesterday began having a rt earache; 6hrs ago noted rt side face "not working"; pt unable to blink rt eye and tearing noted to same; MAEW, PERRL

## 2021-12-30 ENCOUNTER — Ambulatory Visit: Payer: Self-pay | Admitting: Nurse Practitioner

## 2022-06-02 ENCOUNTER — Telehealth: Payer: Self-pay | Admitting: Family Medicine

## 2022-06-02 DIAGNOSIS — M549 Dorsalgia, unspecified: Secondary | ICD-10-CM

## 2022-06-02 NOTE — Progress Notes (Signed)
[place]   Increased back pain with a cough and reports of having some pain with breathing- coughing until vomiting. R/O PNA needed in person.  Patient acknowledged agreement and understanding of the plan.

## 2022-06-02 NOTE — Patient Instructions (Signed)
Go to the nearest Urgent Care to you for in person evaluation of your symptoms

## 2023-01-14 ENCOUNTER — Emergency Department
Admission: EM | Admit: 2023-01-14 | Discharge: 2023-01-14 | Disposition: A | Discharge status: Home / Self Care | Payer: Medicaid Other | Attending: Student in an Organized Health Care Education/Training Program | Admitting: Student in an Organized Health Care Education/Training Program

## 2023-01-14 ENCOUNTER — Emergency Department: Payer: Medicaid Other

## 2023-01-14 ENCOUNTER — Other Ambulatory Visit: Payer: Self-pay

## 2023-01-14 DIAGNOSIS — J189 Pneumonia, unspecified organism: Secondary | ICD-10-CM | POA: Insufficient documentation

## 2023-01-14 DIAGNOSIS — R059 Cough, unspecified: Secondary | ICD-10-CM | POA: Diagnosis present

## 2023-01-14 MED ORDER — DOXYCYCLINE HYCLATE 100 MG PO TABS: 100.0000 mg | ORAL_TABLET | Freq: Two times a day (BID) | ORAL | 0 refills | Status: AC

## 2023-01-14 MED ORDER — LIDOCAINE HCL (PF) 1 % IJ SOLN
INTRAMUSCULAR | Status: AC
  Administered 2023-01-14: 5 mL
  Filled 2023-01-14: qty 5

## 2023-01-14 MED ORDER — CEFTRIAXONE SODIUM 1 G IJ SOLR
1.0000 g | Freq: Once | INTRAMUSCULAR | Status: AC
  Administered 2023-01-14: 1 g via INTRAMUSCULAR
  Filled 2023-01-14: qty 10

## 2023-01-14 MED ORDER — PSEUDOEPH-BROMPHEN-DM 30-2-10 MG/5ML PO SYRP: 10.0000 mL | ORAL_SOLUTION | Freq: Four times a day (QID) | ORAL | 0 refills | Status: AC | PRN

## 2023-01-14 MED ORDER — BENZONATATE 100 MG PO CAPS
200.0000 mg | ORAL_CAPSULE | Freq: Once | ORAL | Status: AC
  Administered 2023-01-14: 200 mg via ORAL
  Filled 2023-01-14: qty 2

## 2023-01-14 MED ORDER — BENZONATATE 100 MG PO CAPS: 100.0000 mg | ORAL_CAPSULE | Freq: Three times a day (TID) | ORAL | 0 refills | Status: AC | PRN

## 2023-01-14 NOTE — ED Triage Notes (Addendum)
Pt to ed from home via POV for a cough x 2 months. Pt has only taken mucinex x 3 days and no other treatment. Pt is caox4, in no acute distress and ambulatory in triage. Pt is concerned she might have pneumonia but has been afebrile.

## 2023-01-14 NOTE — ED Provider Notes (Signed)
[facility] Provider Note  Patient Contact: 6:43 PM (approximate)   History   Cough (X 2 months)   HPI  [name] is a 42 y.o. female who presents to the emergency department with 2 months worth of coughing.  Patient states that it started off as a dry cough, nonproductive.  No fevers, chills, chest pain, shortness of breath.  Patient states that she has had no known sick contacts recently.     Physical Exam   Triage Vital Signs: ED Triage Vitals  Encounter Vitals Group     BP 01/14/23 1540 (!) 186/110     Systolic BP Percentile --      Diastolic BP Percentile --      Pulse Rate 01/14/23 1540 86     Resp 01/14/23 1540 16     Temp 01/14/23 1540 98.2 F (36.8 C)     Temp Source 01/14/23 1540 Oral     SpO2 01/14/23 1540 98 %     Weight --      Height 01/14/23 1538 5\' 4"  (1.626 m)     Head Circumference --      Peak Flow --      Pain Score 01/14/23 1538 0     Pain Loc --      Pain Education --      Exclude from Growth Chart --     Most recent vital signs: Vitals:   01/14/23 1540 01/14/23 1833  BP: (!) 186/110 (!) 170/103  Pulse: 86 76  Resp: 16 18  Temp: 98.2 F (36.8 C)   SpO2: 98% 96%     General: Alert and in no acute distress. ENT:      Ears:       Nose: No congestion/rhinnorhea.      Mouth/Throat: Mucous membranes are moist. Neck: No stridor. No cervical spine tenderness to palpation  Cardiovascular:  Good peripheral perfusion Respiratory: Normal respiratory effort without tachypnea or retractions. Lungs some mild rhonchi to the right lower lung. Good air entry to the bases with no decreased or absent breath sounds. Musculoskeletal: Full range of motion to all extremities.  Neurologic:  No gross focal neurologic deficits are appreciated.  Skin:   No rash noted Other:   ED Results / Procedures / Treatments   Labs (all labs ordered are listed, but only abnormal results are displayed) Labs Reviewed - No data to  display   EKG     RADIOLOGY  I personally viewed, evaluated, and interpreted these images as part of my medical decision making, as well as reviewing the written report by the radiologist.  ED Provider Interpretation: No acute cardiopulmonary finding on chest x-ray  DG Chest 2 View  Result Date: 01/14/2023 CLINICAL DATA:  Cough for 2 months. EXAM: CHEST - 2 VIEW COMPARISON:  None Available. FINDINGS: The heart size and mediastinal contours are within normal limits. Both lungs are clear. The visualized skeletal structures are unremarkable. IMPRESSION: No active cardiopulmonary disease. Electronically Signed   By: Ted Mcalpine M.D.   On: 01/14/2023 16:20    PROCEDURES:  Critical Care performed: No  Procedures   MEDICATIONS ORDERED IN ED: Medications  cefTRIAXone (ROCEPHIN) injection 1 g (has no administration in time range)     IMPRESSION / MDM / ASSESSMENT AND PLAN / ED COURSE  I reviewed the triage vital signs and the nursing notes.  Differential diagnosis includes, but is not limited to, community-acquired pneumonia, viral illness, bronchitis   Patient's presentation is most consistent with acute presentation with potential threat to life or bodily function.   Patient's diagnosis is consistent with commune acquired pneumonia.  Patient presents to the emergency department with cough for roughly 2 months.  Patient states that it is now gotten productive.  There is no fevers, shortness of breath, chest pain..  Chest x-ray is negative at this time for acute consolidation, however given symptoms, productive cough in the room and rhonchi in the right lung I will treat for community-acquired pneumonia.  Rocephin, doxycycline and cough medications.  Patient is given ED precautions to return to the ED for any worsening or new symptoms.     FINAL CLINICAL IMPRESSION(S) / ED DIAGNOSES   Final diagnoses:  Community acquired pneumonia,  unspecified laterality     Rx / DC Orders   ED Discharge Orders          Ordered    doxycycline (VIBRA-TABS) 100 MG tablet  2 times daily        01/14/23 1847    benzonatate (TESSALON PERLES) 100 MG capsule  3 times daily PRN        01/14/23 1847    brompheniramine-pseudoephedrine-DM 30-2-10 MG/5ML syrup  4 times daily PRN        01/14/23 1847             Note:  This document was prepared using Dragon voice recognition software and may include unintentional dictation errors.   Lanette Hampshire 01/14/23 1849    Willy Eddy, MD 01/14/23 1919
